# Patient Record
Sex: Female | Born: 1960 | Race: White | Hispanic: No | Marital: Married | Smoking: Never smoker
Health system: Southern US, Community
[De-identification: ages and names within clinical notes are randomized; demographics above are authoritative.]

## PROBLEM LIST (undated history)

## (undated) DIAGNOSIS — E079 Disorder of thyroid, unspecified: Secondary | ICD-10-CM

---

## 2005-06-01 ENCOUNTER — Ambulatory Visit: Payer: Self-pay | Admitting: Internal Medicine

## 2006-04-16 ENCOUNTER — Emergency Department (HOSPITAL_COMMUNITY): Admission: EM | Admit: 2006-04-16 | Discharge: 2006-04-16 | Payer: Self-pay | Admitting: Emergency Medicine

## 2006-05-01 ENCOUNTER — Inpatient Hospital Stay (HOSPITAL_COMMUNITY): Admission: AD | Admit: 2006-05-01 | Discharge: 2006-05-01 | Payer: Self-pay | Admitting: Obstetrics & Gynecology

## 2006-05-05 ENCOUNTER — Inpatient Hospital Stay (HOSPITAL_COMMUNITY): Admission: AD | Admit: 2006-05-05 | Discharge: 2006-05-06 | Payer: Self-pay | Admitting: Gynecology

## 2006-05-18 ENCOUNTER — Ambulatory Visit: Payer: Self-pay | Admitting: Obstetrics and Gynecology

## 2006-05-18 ENCOUNTER — Encounter (INDEPENDENT_AMBULATORY_CARE_PROVIDER_SITE_OTHER): Payer: Self-pay | Admitting: Specialist

## 2006-05-18 ENCOUNTER — Other Ambulatory Visit: Admission: RE | Admit: 2006-05-18 | Discharge: 2006-05-18 | Payer: Self-pay | Admitting: Obstetrics and Gynecology

## 2006-05-18 ENCOUNTER — Encounter (INDEPENDENT_AMBULATORY_CARE_PROVIDER_SITE_OTHER): Payer: Self-pay | Admitting: *Deleted

## 2007-05-21 ENCOUNTER — Encounter: Payer: Self-pay | Admitting: Internal Medicine

## 2007-05-21 DIAGNOSIS — E039 Hypothyroidism, unspecified: Secondary | ICD-10-CM | POA: Insufficient documentation

## 2007-05-22 ENCOUNTER — Ambulatory Visit: Payer: Self-pay | Admitting: Internal Medicine

## 2007-07-26 ENCOUNTER — Encounter (INDEPENDENT_AMBULATORY_CARE_PROVIDER_SITE_OTHER): Payer: Self-pay | Admitting: *Deleted

## 2007-07-26 ENCOUNTER — Ambulatory Visit: Payer: Self-pay | Admitting: *Deleted

## 2008-08-14 ENCOUNTER — Emergency Department (HOSPITAL_COMMUNITY): Admission: EM | Admit: 2008-08-14 | Discharge: 2008-08-14 | Payer: Self-pay | Admitting: Emergency Medicine

## 2010-12-28 NOTE — Group Therapy Note (Signed)
NAMEARTA, STUMP.:  [id number]   MEDICAL RECORD NO.:  [id number]          PATIENT TYPE:  WOC   LOCATION:  [facility]                   FACILITY:  [facility]   PHYSICIAN:  Argentina Donovan, MD        DATE OF BIRTH:  [birth year]   DATE OF SERVICE:  07/26/2007                                  CLINIC NOTE   CHIEF COMPLAINT:  Heavy vaginal bleeding and yearly Pap smear.   HISTORY OF PRESENT ILLNESS:  This is a 50 year old gravida 4, para 4-0-0-  4 female from [place] presenting for yearly Pap smear.  She also has a  complaint of heavy vaginal bleeding, requiring 5-10 pads daily since  Thanksgiving, 2008.  She states that she had this occurring last year  from August through November.  She was seen by Dr. Okey Dupre here at the  [facility].  At that time, endometrial biopsy was performed  that showed secretory endometrium and benign scant endocervix.  Pap  smear at that time was negative as well.  She was started on Yaz oral  contraceptives pills, which helped with her bleeding somewhat.  She  states that in January of this year, she went to the [place], where she  had a dilatation and curettage to treat this heavy vaginal bleeding.  She states that her bleeding was improved, and she was having normal  periods that were monthly, lasting seven days, until this Thanksgiving.  At that time, she started having heavy bleeding daily until now,  requiring 5-10 pads daily.  She states that she has some cramping  abdominal pain with bleeding.  She has no other complaints today.  She  denies any breast masses, lesions, or other complaints today.   PAST MEDICAL HISTORY:  Hypothyroidism.   PAST SURGICAL HISTORY:  Dilatation and curettage in January of this  year.   MEDICATIONS:  1. Synthroid 88 mcg daily.  2. Yaz 1 tablet daily.   ALLERGIES:  No known drug allergies.   PHYSICAL EXAMINATION:  This is a well-appearing Caucasian female in no  distress.  VITALS:   Temperature 99.1, pulse 100, blood pressure 126/80.  Weight  161.6 pounds.  CARDIOVASCULAR:  Heart has a regular rate and rhythm.  No murmurs, rubs  or gallops are appreciated.  RESPIRATORY:  Lungs are clear to auscultation bilaterally.  BREASTS:  The breasts appear symmetric.  There is no mass or lump  palpated.  There is no nipple retraction or dimpling of the breast.  ABDOMEN:  Soft.  Mild diffuse tenderness to palpation.  No masses are  palpable.  GENITOURINARY:  Normal external female genitalia.  Vaginal mucosa is  pink and moist.  There is blood in the vaginal vault.  Cervix is  visualized midline with blood from the os.  Pap smear is collected on  bimanual examination.  Uterus is midline.  Normal size.  Adnexa very  difficult to palpate secondary to body habitus.  EXTREMITIES:  No clubbing, cyanosis or edema.   ASSESSMENT/PLAN:  This is a 50 year old [place] female with  dysfunctional uterine bleeding and yearly examination.  1. The patient  presents for breast examination and yearly Pap smear,      which were performed today.  2. Check CBC due to complaint of heavy vaginal bleeding.  Patient does      have a history of anemia in the past.  3. Will give a shot of Depo-Provera today to help stop her bleeding.  4. Will schedule for endometrial ablation, as the patient has      recurrent dysfunctional uterine bleeding.   This patient was discussed with Dr. Okey Dupre, and the surgery has been  scheduled with him.  The patient will be called with the date for that  surgery.     ______________________________  Karlton Lemon, MD    ______________________________  Argentina Donovan, MD    NS/MEDQ  D:  07/26/2007  T:  07/27/2007  Job:  [id number]

## 2010-12-31 NOTE — Group Therapy Note (Signed)
NAMEPAULLETTE, MCKAIN.:  [id number]   MEDICAL RECORD NO.:  [id number]          PATIENT TYPE:  WOC   LOCATION:  [facility]                   FACILITY:  [facility]   PHYSICIAN:  Argentina Donovan, MD        DATE OF BIRTH:  1961-03-26   DATE OF SERVICE:                                    CLINIC NOTE   The patient is a 50 year old, gravida 4, para 4-0-0-4, white female from  [place], who was seen two times in the emergency room at [facility]  in late September, and twice within a few days at [facility] because  of dysfunctional uterine bleeding.  On her first visit to [facility],  she had a hemoglobin of 9.6, four days later 8.6.  She had regular periods  up until this heavy bleeding started about a month and half ago.  She has  had no history of significant GYN problems.  She is on Synthroid 0.088 mg a  day and thyroid function tests were normal when done on that hospital visit.  She comes in today having been put on Provera by Dr. Hyacinth Meeker with  satisfactory result that did control the bleeding.  She has had an  ultrasound that showed a slight thickening in the endometrium, otherwise it  was fairly not remarkable.   PHYSICAL EXAMINATION:  ABDOMEN:  Soft, nontender.  No masses or  organomegaly.  EXTERNAL GENITALIA:  Normal.  BUS is within normal limits.  PELVIC:  Vagina was clean and well rugae.  Cervix is clean and parous.  Uterus is anterior of normal size, shape, and consistency.  Adnexa normal.   The uterus was sounded to a depth of 8-cm.  An endometrial biopsy was  obtained without incident.  Also, a Pap smear was taken.   The plan was to switch the patient over to oral contraceptives.  She is a  nonsmoker.  We started with the Yaz, if that is satisfactory and can control  it, she will be able to stay on some oral contraceptive.  If she starts  having significant breakthrough bleeding or we cannot control it for other  reasons, endometrial ablation  has been explained to the patient and that  will be our next step.   CBC will be drawn today and the patient is and will remain on iron.   IMPRESSION:  Dysfunctional uterine bleeding.   Pap smear endometrial biopsy and CBC taken.   Rx Yaz, samples of 3 months have been given to the patient, __________  prescription for a year.           ______________________________  Argentina Donovan, MD     PR/MEDQ  D:  05/18/2006  T:  05/19/2006  Job:  [id number]

## 2016-05-29 ENCOUNTER — Emergency Department (HOSPITAL_COMMUNITY)
Admission: EM | Admit: 2016-05-29 | Discharge: 2016-05-30 | Disposition: A | Discharge status: Home / Self Care | Payer: Self-pay | Attending: Emergency Medicine | Admitting: Emergency Medicine

## 2016-05-29 ENCOUNTER — Emergency Department (HOSPITAL_COMMUNITY): Payer: Self-pay

## 2016-05-29 ENCOUNTER — Encounter (HOSPITAL_COMMUNITY): Payer: Self-pay

## 2016-05-29 DIAGNOSIS — R000 Tachycardia, unspecified: Secondary | ICD-10-CM

## 2016-05-29 DIAGNOSIS — E039 Hypothyroidism, unspecified: Secondary | ICD-10-CM | POA: Insufficient documentation

## 2016-05-29 DIAGNOSIS — R079 Chest pain, unspecified: Secondary | ICD-10-CM | POA: Insufficient documentation

## 2016-05-29 DIAGNOSIS — Z7982 Long term (current) use of aspirin: Secondary | ICD-10-CM | POA: Insufficient documentation

## 2016-05-29 LAB — BASIC METABOLIC PANEL
Anion gap: 12 (ref 5–15)
BUN: 15 mg/dL (ref 6–20)
CALCIUM: 9.8 mg/dL (ref 8.9–10.3)
CHLORIDE: 106 mmol/L (ref 101–111)
CO2: 22 mmol/L (ref 22–32)
CREATININE: 0.79 mg/dL (ref 0.44–1.00)
GFR calc Af Amer: >60 mL/min (ref >60)
GFR calc non Af Amer: >60 mL/min (ref >60)
Glucose, Bld: 145 mg/dL — ABNORMAL HIGH (ref 65–99)
Potassium: 3.5 mmol/L (ref 3.5–5.1)
SODIUM: 140 mmol/L (ref 135–145)

## 2016-05-29 LAB — I-STAT TROPONIN, ED: TROPONIN I, POC: 0.01 ng/mL (ref 0.00–0.08)

## 2016-05-29 LAB — CBC
HCT: 40.3 % (ref 36.0–46.0)
Hemoglobin: 13.9 g/dL (ref 12.0–15.0)
MCH: 30.6 pg (ref 26.0–34.0)
MCHC: 34.5 g/dL (ref 30.0–36.0)
MCV: 88.8 fL (ref 78.0–100.0)
PLATELETS: 238 10*3/uL (ref 150–400)
RBC: 4.54 MIL/uL (ref 3.87–5.11)
RDW: 13.1 % (ref 11.5–15.5)
WBC: 7.1 10*3/uL (ref 4.0–10.5)

## 2016-05-29 MED ORDER — SODIUM CHLORIDE 0.9 % IV BOLUS (SEPSIS)
1000.0000 mL | Freq: Once | INTRAVENOUS | Status: AC
  Administered 2016-05-30: 1000 mL via INTRAVENOUS

## 2016-05-29 MED ORDER — GI COCKTAIL ~~LOC~~
30.0000 mL | Freq: Once | ORAL | Status: AC
  Administered 2016-05-30: 30 mL via ORAL
  Filled 2016-05-29: qty 30

## 2016-05-29 NOTE — ED Triage Notes (Signed)
Pt complaining of sharp left chest pain that radiates to L arm. Pt also complaining of generalized weakness and headache x 4 days.

## 2016-05-29 NOTE — ED Provider Notes (Signed)
[facility] Note   CSN: [id number] Arrival date & time: 05/29/16  1840  By signing my name below, I, Suzan Slick. Elon Spanner, attest that this documentation has been prepared under the direction and in the presence of .  Electronically Signed: Suzan Slick. Elon Spanner, ED Scribe. 05/29/16. 11:49 PM.    History   Chief Complaint Chief Complaint  Patient presents with  . Chest Pain   The history is provided by the patient. No language interpreter was used.    HPI Comments: Summer Roberts is a 55 y.o. female with a PPMHx of hypothyroidism who presents to the Emergency Department complaining of intermittent, unchanged L sided chest pain that radiates down the L arm x 4 days. Pain is described as sharp. Episodes would last for a few minutes which is occasionally accompanied with "my heart racing". She also reports nausea, intermittent HA, generalized weakness, and mild diaphoresis, along with mild abdominal cramping and a few episodes of diarrhea. Pt states symptoms made her feel as though she could pass out. She admits symptoms are exacerbated with exertion and improved at rest. OTC Ibuprofen and ASA attempted at home with mild temporary improvement. Denies any fever, chills, or shortness of breath. No prior history of cancer. Denies any recent long distance travel. No prior surgeries within the last 6 months.  PCP: Kathyrn Sheriff, MD    History reviewed. No pertinent past medical history.  Patient Active Problem List   Diagnosis Date Noted  . HYPOTHYROIDISM 05/21/2007    History reviewed. No pertinent surgical history.  OB History    No data available       Home Medications    Prior to Admission medications   Medication Sig Start Date End Date Taking? Authorizing Provider  aspirin EC 81 MG tablet Take 81 mg by mouth daily as needed (for pain or headaches).  03/18/09  Yes Historical Provider, MD  ibuprofen (ADVIL,MOTRIN) 200 MG tablet Take 600 mg by mouth every 6 (six) hours as  needed for headache (or pain).   Yes Historical Provider, MD  levothyroxine (SYNTHROID, LEVOTHROID) 112 MCG tablet Take 112 mcg by mouth daily before breakfast. 10/23/15 10/22/16 Yes Historical Provider, MD    Family History History reviewed. No pertinent family history.  Social History Social History  Substance Use Topics  . Smoking status: Never Smoker  . Smokeless tobacco: Never Used  . Alcohol use No     Allergies   Review of patient's allergies indicates no known allergies.   Review of Systems Review of Systems  Constitutional: Positive for diaphoresis. Negative for chills and fever.  Cardiovascular: Positive for chest pain and palpitations.  Gastrointestinal: Positive for abdominal pain, diarrhea and nausea. Negative for vomiting.  Musculoskeletal: Positive for arthralgias.  Neurological: Positive for weakness and headaches.  Psychiatric/Behavioral: Negative for confusion.  All other systems reviewed and are negative.    Physical Exam Updated Vital Signs BP 147/88   Pulse 74   Temp 97.9 F (36.6 C) (Oral)   Resp 21   LMP  (LMP Unknown)   SpO2 98%   Physical Exam  Constitutional: She is oriented to person, place, and time. She appears well-developed and well-nourished. No distress.  HENT:  Head: Normocephalic and atraumatic.  Eyes: EOM are normal. Pupils are equal, round, and reactive to light.  Neck: Normal range of motion. Neck supple.  Cardiovascular: Normal rate, regular rhythm and normal heart sounds.  Exam reveals no gallop and no friction rub.   No murmur heard. Equal palpable pulses.  Pulmonary/Chest: Effort normal. She has no wheezes. She has no rales.  Abdominal: Soft. She exhibits no distension. There is no tenderness.  Musculoskeletal: She exhibits no edema or tenderness.  No lower extremity edema.  Neurological: She is alert and oriented to person, place, and time.  Skin: Skin is warm and dry. She is not diaphoretic.  Psychiatric: She has a  normal mood and affect. Her behavior is normal.  Nursing note and vitals reviewed.    ED Treatments / Results   DIAGNOSTIC STUDIES: Oxygen Saturation is 100% on RA, Normal by my interpretation.    COORDINATION OF CARE: 11:40 PM- Will give fluids and GI cocktail. Will order imaging, EKG, and blood work. Discussed treatment plan with pt at bedside and pt agreed to plan.     Labs (all labs ordered are listed, but only abnormal results are displayed) Labs Reviewed  BASIC METABOLIC PANEL - Abnormal; Notable for the following:       Result Value   Glucose, Bld 145 (*)    All other components within normal limits  CBC  I-STAT TROPOININ, ED  I-STAT TROPOININ, ED    EKG  EKG Interpretation  Date/Time:  Sunday May 29 2016 18:47:12 EDT Ventricular Rate:  117 PR Interval:  148 QRS Duration: 84 QT Interval:  320 QTC Calculation: 446 R Axis:   -91 Text Interpretation:  Sinus tachycardia Right superior axis deviation Pulmonary disease pattern Nonspecific ST abnormality Abnormal ECG No old tracing to compare Confirmed by Krystyne Tewksbury MD, DANIEL [id number]) on 05/29/2016 11:05:06 PM       Radiology Dg Chest 2 View  Result Date: 05/29/2016 CLINICAL DATA:  Dyspnea, central chest pain and left shoulder pain for the past 4 days. Fever, headache and nausea. EXAM: CHEST  2 VIEW COMPARISON:  08/14/2008 FINDINGS: Cardiomediastinal contours are within normal limits. Mild interstitial prominence may be due to small airway inflammation. No pneumonic consolidation. No pneumothorax, effusion or adenopathy. No acute osseous abnormality. IMPRESSION: Mild increase in bilateral interstitial prominence could be due to small airway inflammation otherwise negative exam. Electronically Signed   By: Tollie Ethavid  Kwon M.D.   On: 05/29/2016 19:29    Procedures Procedures (including critical care time)  Medications Ordered in ED Medications  sodium chloride 0.9 % bolus 1,000 mL (0 mLs Intravenous Stopped 05/30/16 0053)   gi cocktail (Maalox,Lidocaine,Donnatal) (30 mLs Oral Given 05/30/16 0003)     Initial Impression / Assessment and Plan / ED Course  I have reviewed the triage vital signs and the nursing notes.  Pertinent labs & imaging results that were available during my care of the patient were reviewed by me and considered in my medical decision making (see chart for details).  Clinical Course    55 yo F With a chief complaint of chest pain. This been going on for about 3 or 4 days. Associated with diarrhea and lower abdominal cramping. The diarrhea has resolved with patient continues to have this sensation of palpitations and shortness of breath when she gets up to walk. Initial HR was in the 120s.  Wells 1.5 with tachycardia. This resolved spontaneously without intervention. Patient's repeat EKG with no significant findings. Delta troponin negative. Discharge home.   1:36 AM:  I have discussed the diagnosis/risks/treatment options with the patient and family and believe the pt to be eligible for discharge home to follow-up with PCP. We also discussed returning to the ED immediately if new or worsening sx occur. We discussed the sx which are most concerning (e.g., sudden  worsening pain, fever, inability to tolerate by mouth) that necessitate immediate return. Medications administered to the patient during their visit and any new prescriptions provided to the patient are listed below.  Medications given during this visit Medications  sodium chloride 0.9 % bolus 1,000 mL (0 mLs Intravenous Stopped 05/30/16 0053)  gi cocktail (Maalox,Lidocaine,Donnatal) (30 mLs Oral Given 05/30/16 0003)     The patient appears reasonably screen and/or stabilized for discharge and I doubt any other medical condition or other [facility] requiring further screening, evaluation, or treatment in the ED at this time prior to discharge.   Final Clinical Impressions(s) / ED Diagnoses   Final diagnoses:  Nonspecific chest pain    Tachycardia    New Prescriptions New Prescriptions   No medications on file     Melene Plan, DO 05/30/16 0136

## 2016-05-30 LAB — I-STAT TROPONIN, ED: Troponin i, poc: 0.01 ng/mL (ref 0.00–0.08)

## 2016-05-30 NOTE — ED Notes (Signed)
Pt showing NAD. RR even and unlabored. Family @ bedside. Voices no questions/concerns.

## 2017-05-10 ENCOUNTER — Ambulatory Visit (HOSPITAL_COMMUNITY)
Admission: EM | Admit: 2017-05-10 | Discharge: 2017-05-10 | Disposition: A | Discharge status: Home / Self Care | Payer: BLUE CROSS/BLUE SHIELD | Attending: Internal Medicine | Admitting: Internal Medicine

## 2017-05-10 ENCOUNTER — Encounter (HOSPITAL_COMMUNITY): Payer: Self-pay | Admitting: *Deleted

## 2017-05-10 DIAGNOSIS — R0789 Other chest pain: Secondary | ICD-10-CM

## 2017-05-10 DIAGNOSIS — R1010 Upper abdominal pain, unspecified: Secondary | ICD-10-CM | POA: Diagnosis not present

## 2017-05-10 DIAGNOSIS — M79602 Pain in left arm: Secondary | ICD-10-CM

## 2017-05-10 HISTORY — DX: Disorder of thyroid, unspecified: E07.9

## 2017-05-10 MED ORDER — FAMOTIDINE 40 MG PO TABS: 40.0000 mg | ORAL_TABLET | Freq: Two times a day (BID) | ORAL | 0 refills | Status: DC

## 2017-05-10 NOTE — ED Provider Notes (Signed)
[facility]    CSN: [id number] Arrival date & time: 05/10/17  1002  History   Chief Complaint Chief Complaint  Patient presents with  . Arm Pain    HPI Summer Roberts is a 56 y.o. female.   HPI  Patient presents with midsternal chest pain and L arm pain.   Midsternal chest pain Began in the spring (~4-6 months ago). Located midsternally but has also had some upper abdominal pain around midline. Patient present before eating but is worse after eating. Also reports an odd sensation in her throat sometimes accompanying the pain. Says she does not have trouble swallowing but it feels "like it does when you try to swallow an egg." Denies regurgitation but endorses feeling of nausea at times. Takes aspirin which helps with pain some. Her brother is an anesthesiologist and told her to start taking Pepcid. She has been taking Pepcid  daily for the past three days but has not noticed any improvement. Denies difficulty breathing or diaphoresis accompanying chest pain. Is concerned that she may have had/be having a heart attack, which is primarily why she came in today.   L arm pain Also began in the spring. Pain located in upper arm. Patient can point to exact location of pain. Has not taken any medicine to help with pain. Also endorses some weakness in this arm. No history of trauma. 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 Denies erythema, ecchymosis. Pain is worse with movement.    Past Medical History:  Diagnosis Date  . Thyroid disease     Patient Active Problem List   Diagnosis Date Noted  . HYPOTHYROIDISM 05/21/2007    History reviewed. No pertinent surgical history.  OB History    No data available       Home Medications    Prior to Admission medications   Medication Sig Start Date End Date Taking? Authorizing Provider  aspirin EC 81 MG tablet Take 81 mg by mouth daily as needed (for pain or  headaches).  03/18/09   [provider]  famotidine (PEPCID) 40 MG tablet Take 1 tablet (40 mg total) by mouth 2 (two) times daily. 05/10/17 05/10/18  Marquette Saa, MD  ibuprofen (ADVIL,MOTRIN) 200 MG tablet Take 600 mg by mouth every 6 (six) hours as needed for headache (or pain).    [provider]  levothyroxine (SYNTHROID, LEVOTHROID) 112 MCG tablet Take 112 mcg by mouth daily before breakfast. 10/23/15 10/22/16  [provider]    Family History History reviewed. No pertinent family history.  Social History Social History  Substance Use Topics  . Smoking status: Never Smoker  . Smokeless tobacco: Never Used  . Alcohol use No     Allergies   Patient has no known allergies.   Review of Systems Review of Systems  Constitutional: Negative for diaphoresis.  Respiratory: Negative for shortness of breath.   Cardiovascular: Negative for palpitations.       Midsternal chest pain  Gastrointestinal: Positive for abdominal pain (Upper quadrants at midline) and nausea. Negative for vomiting.  Skin: Negative for wound.  Neurological: Positive for weakness (L arm).    Physical Exam  Updated Vital Signs BP (!) 142/84 (BP Location: Right Arm)   Pulse 78   Temp 98.6 F (37 C) (Oral)   Resp 18   LMP  (LMP Unknown)   SpO2 99%   Physical Exam  Constitutional: She is oriented to person,  place, and time. She appears well-developed and well-nourished. No distress.  HENT:  Head: Normocephalic and atraumatic.  Nose: Nose normal.  Mouth/Throat: Oropharynx is clear and moist. No oropharyngeal exudate.  Eyes: Pupils are equal, round, and reactive to light. Conjunctivae and EOM are normal. Right eye exhibits no discharge. Left eye exhibits no discharge.  Neck: Normal range of motion. Neck supple. No thyromegaly present.  Cardiovascular: Normal rate, regular rhythm and normal heart sounds.   No murmur heard. Pulmonary/Chest: Effort normal and breath  sounds normal. No respiratory distress. She has no wheezes.  No TTP of chest at midline or elsewhere  Abdominal: Soft. Bowel sounds are normal. She exhibits no distension and no mass. There is no tenderness. There is no guarding.  Musculoskeletal: Normal range of motion.  5/5 strength upper extremities bilaterally. Normal grip strength. TTP in L upper arm at area where patient reports pain.   Lymphadenopathy:    She has no cervical adenopathy.  Neurological: She is alert and oriented to person, place, and time. No cranial nerve deficit.  Skin:  No erythema, ecchymosis, or other skin changes on arm. Skin warm and dry.   Psychiatric: She has a normal mood and affect. Her behavior is normal.    UC Treatments / Results  Labs (all labs ordered are listed, but only abnormal results are displayed) Labs Reviewed - No data to display  EKG  EKG Interpretation None       Radiology No results found.  Procedures Procedures (including critical care time)  Medications Ordered in UC Medications - No data to display   Initial Impression / Assessment and Plan / UC Course  I have reviewed the triage vital signs and the nursing notes.  Pertinent labs & imaging results that were available during my care of the patient were reviewed by me and considered in my medical decision making (see chart for details).    Patient presenting with midsternal chest pain and L upper arm pain. Symptoms do not seem related. Regarding chest pain, does not seem cardiac in etiology. EKG obtained which showed NSR and no signs of current or prior MI. No TTP of chest, making MSK etiology less likely. GERD more likely diagnosis, given location of pain (midsternal and upper abdominal), as well as worsening with food. Patient has been taking Pepcid, but only  and only for three days. Suspect will have improvement with continued Pepcid use at higher dose. Will begin trial of  BID x12wks.  Arm pain most likely MSK  etiology, as patient able to specifically localize pain and there is TTP in that area. Pain also worse with movement supportive of MSK etiology. No abnormalities noted on exam other than tenderness. Treat with ibuprofen/Tylenol PRN.  Strict return precautions given. Also encouraged patient to schedule appt with her PCP at her earliest convenience to ensure symptoms improving with higher dose of Pepcid.   Final Clinical Impressions(s) / UC Diagnoses   Final diagnoses:  Left arm pain  Midsternal chest pain    New Prescriptions Current Discharge Medication List    START taking these medications   Details  famotidine (PEPCID) 40 MG tablet Take 1 tablet (40 mg total) by mouth 2 (two) times daily. Qty: 180 tablet, Refills: 0       Tarri Abernethy, MD, MPH PGY-3     Marquette Saa, MD 05/10/17 [phone]

## 2017-05-10 NOTE — Discharge Instructions (Signed)
It was nice meeting you today Ms. Weare!  I do not think your chest and arm pain are related to your heart. Your EKG did not show any signs of a heart attack.   Your chest pain is probably due to acid reflux (heartburn). Please begin taking the prescription famotidine one tablet two times a day. This is a higher dose than the over the counter version. You will take two tablets a day for the next three months until all the pills are finished.   For your arm pain, you can take Tylenol or ibuprofen as needed every 4-6 hours.   If you have more severe chest pain with difficulty breathing or sweating, please go to the emergency room.   Please schedule an appointment with your regular doctor at your earliest convenience to make sure the higher dose of famotidine is helping with your symptoms.   If you have any questions or concerns, please feel free to call the clinic.   Be well,  Dr. Natale Milch

## 2017-05-10 NOTE — ED Triage Notes (Signed)
Pt  Reports   Symptoms  Of  l   Shoulder /   Arm  Pain   X  5 days   With   Some  Epigastric  Pain  As   Well the  Epigastric  Pain is  intermittant      The  Pt  Has  A  History  Of thyriod disease

## 2018-04-09 ENCOUNTER — Encounter (HOSPITAL_COMMUNITY): Payer: Self-pay | Admitting: Emergency Medicine

## 2018-04-09 ENCOUNTER — Ambulatory Visit (HOSPITAL_COMMUNITY)
Admission: EM | Admit: 2018-04-09 | Discharge: 2018-04-09 | Disposition: A | Discharge status: Home / Self Care | Payer: BLUE CROSS/BLUE SHIELD | Attending: Family Medicine | Admitting: Family Medicine

## 2018-04-09 ENCOUNTER — Emergency Department (HOSPITAL_COMMUNITY): Payer: BLUE CROSS/BLUE SHIELD

## 2018-04-09 ENCOUNTER — Emergency Department (HOSPITAL_COMMUNITY)
Admission: EM | Admit: 2018-04-09 | Discharge: 2018-04-09 | Disposition: A | Discharge status: Home / Self Care | Payer: BLUE CROSS/BLUE SHIELD | Attending: Emergency Medicine | Admitting: Emergency Medicine

## 2018-04-09 DIAGNOSIS — R079 Chest pain, unspecified: Secondary | ICD-10-CM

## 2018-04-09 DIAGNOSIS — R002 Palpitations: Secondary | ICD-10-CM

## 2018-04-09 DIAGNOSIS — F439 Reaction to severe stress, unspecified: Secondary | ICD-10-CM | POA: Diagnosis not present

## 2018-04-09 DIAGNOSIS — F419 Anxiety disorder, unspecified: Secondary | ICD-10-CM

## 2018-04-09 DIAGNOSIS — Z79899 Other long term (current) drug therapy: Secondary | ICD-10-CM | POA: Diagnosis not present

## 2018-04-09 DIAGNOSIS — E039 Hypothyroidism, unspecified: Secondary | ICD-10-CM | POA: Diagnosis not present

## 2018-04-09 LAB — BASIC METABOLIC PANEL
Anion gap: 12 (ref 5–15)
BUN: 6 mg/dL (ref 6–20)
CALCIUM: 10 mg/dL (ref 8.9–10.3)
CHLORIDE: 107 mmol/L (ref 98–111)
CO2: 25 mmol/L (ref 22–32)
CREATININE: 0.63 mg/dL (ref 0.44–1.00)
Glucose, Bld: 109 mg/dL — ABNORMAL HIGH (ref 70–99)
Potassium: 3.9 mmol/L (ref 3.5–5.1)
SODIUM: 144 mmol/L (ref 135–145)

## 2018-04-09 LAB — CBC WITH DIFFERENTIAL/PLATELET
Abs Immature Granulocytes: 0 10*3/uL (ref 0.0–0.1)
BASOS PCT: 0 %
Basophils Absolute: 0 10*3/uL (ref 0.0–0.1)
EOS ABS: 0 10*3/uL (ref 0.0–0.7)
EOS PCT: 0 %
HCT: 45.4 % (ref 36.0–46.0)
Hemoglobin: 15.4 g/dL — ABNORMAL HIGH (ref 12.0–15.0)
IMMATURE GRANULOCYTES: 0 %
LYMPHS ABS: 1.4 10*3/uL (ref 0.7–4.0)
Lymphocytes Relative: 26 %
MCH: 30.4 pg (ref 26.0–34.0)
MCHC: 33.9 g/dL (ref 30.0–36.0)
MCV: 89.7 fL (ref 78.0–100.0)
Monocytes Absolute: 0.3 10*3/uL (ref 0.1–1.0)
Monocytes Relative: 7 %
NEUTROS PCT: 67 %
Neutro Abs: 3.5 10*3/uL (ref 1.7–7.7)
PLATELETS: 236 10*3/uL (ref 150–400)
RBC: 5.06 MIL/uL (ref 3.87–5.11)
RDW: 12.6 % (ref 11.5–15.5)
WBC: 5.3 10*3/uL (ref 4.0–10.5)

## 2018-04-09 LAB — I-STAT TROPONIN, ED
TROPONIN I, POC: 0 ng/mL (ref 0.00–0.08)
Troponin i, poc: 0 ng/mL (ref 0.00–0.08)
Troponin i, poc: 0 ng/mL (ref 0.00–0.08)

## 2018-04-09 LAB — D-DIMER, QUANTITATIVE (NOT AT ARMC): D DIMER QUANT: 0.92 ug{FEU}/mL — AB (ref 0.00–0.50)

## 2018-04-09 MED ORDER — IOPAMIDOL (ISOVUE-370) INJECTION 76%
100.0000 mL | Freq: Once | INTRAVENOUS | Status: AC | PRN
  Administered 2018-04-09: 100 mL via INTRAVENOUS

## 2018-04-09 MED ORDER — SODIUM CHLORIDE 0.9 % IV BOLUS
1000.0000 mL | Freq: Once | INTRAVENOUS | Status: AC
  Administered 2018-04-09: 1000 mL via INTRAVENOUS

## 2018-04-09 MED ORDER — IOPAMIDOL (ISOVUE-370) INJECTION 76%
INTRAVENOUS | Status: AC
  Filled 2018-04-09: qty 100

## 2018-04-09 NOTE — ED Triage Notes (Signed)
Pt here for increased BP and increased stress due to family issues; pt sts she has not been eating well due to no appetite and stress; pt appears anxious

## 2018-04-09 NOTE — Discharge Instructions (Signed)
Please go to the ER for further evaluation of your chest pain and palpitations.

## 2018-04-09 NOTE — ED Provider Notes (Signed)
[facility] EMERGENCY DEPARTMENT Provider Note   CSN: [id number] Arrival date & time: 04/09/18  1347     History   Chief Complaint Chief Complaint  Patient presents with  . EKG Changes  . Palpitations    HPI Summer Roberts is a 57 y.o. female.  HPI Pt states she has felt bad this past week.  She has been very stressed, her son is getting divorced.  His wife left him.  Her blood pressure has been up and down  She has been having pain off and on her chest.  It is in the center of the chest, not too sharp but each day it is different.  Nothing seems to bring it on in particlar although today in the morning was worse.  It lasted for 7 hours today and the whole last weekend.  She is also worried about her blood pressure being high.  She has a precription but only takes it when she feels like she needs it.  (losartan) 25mg   Pt feels like she is dehydrated and needs IV fluids.  She has not wanted to drink much lately.  She has a priamry care doctor but it is in [place].  She went to an urgent care and was sent to the ED. Past Medical History:  Diagnosis Date  . Thyroid disease     Patient Active Problem List   Diagnosis Date Noted  . HYPOTHYROIDISM 05/21/2007    No past surgical history on file.   OB History   None      Home Medications    Prior to Admission medications   Medication Sig Start Date End Date Taking? Authorizing Provider  acetaminophen (TYLENOL) 500 MG tablet Take 1,000 mg by mouth every 6 (six) hours as needed for mild pain.   Yes [provider]  aspirin EC 81 MG tablet Take 81 mg by mouth daily as needed (for pain or headaches).  03/18/09  Yes [provider]  aspirin-acetaminophen-caffeine (EXCEDRIN MIGRAINE) [phone] MG tablet Take 2 tablets by mouth as needed (chest pain).   Yes [provider]  famotidine (PEPCID) 40 MG tablet Take 1 tablet (40 mg total) by mouth 2 (two) times daily. 05/10/17 05/10/18  Yes Marquette Saa, MD  ibuprofen (ADVIL,MOTRIN) 200 MG tablet Take 600 mg by mouth every 6 (six) hours as needed for headache (or pain).   Yes [provider]  levothyroxine (SYNTHROID, LEVOTHROID) 125 MCG tablet Take 125 mcg by mouth daily before breakfast.  10/23/15 04/09/18 Yes [provider]  losartan (COZAAR) 25 MG tablet Take 25 mg by mouth daily. 01/16/18  Yes [provider]  naproxen sodium (ALEVE) 220 MG tablet Take 220 mg by mouth as needed (chest pain).   Yes [provider]  VALERIAN ROOT PO Take 2 capsules by mouth 2 (two) times daily.   Yes [provider]    Family History No family history on file.  Social History Social History   Tobacco Use  . Smoking status: Never Smoker  . Smokeless tobacco: Never Used  Substance Use Topics  . Alcohol use: No  . Drug use: Never     Allergies   Patient has no known allergies.   Review of Systems Review of Systems  Constitutional: Negative for fever.  Respiratory: Positive for shortness of breath (sometimes).   Cardiovascular: Positive for chest pain.  Gastrointestinal: Negative for vomiting.  Genitourinary: Positive for frequency.  Neurological: Negative for headaches.  Pins and needles in legs, legs feel weak  Psychiatric/Behavioral: Negative for self-injury.  All other systems reviewed and are negative.    Physical Exam Updated Vital Signs BP 139/80   Pulse 77   Temp 98.2 F (36.8 C) (Oral)   Resp 13   Ht 1.626 m (5\' 4" )   Wt 81.2 kg   LMP  (LMP Unknown)   SpO2 97%   BMI 30.73 kg/m   Physical Exam  Constitutional: She appears well-developed and well-nourished. No distress.  HENT:  Head: Normocephalic and atraumatic.  Right Ear: External ear normal.  Left Ear: External ear normal.  Eyes: Conjunctivae are normal. Right eye exhibits no discharge. Left eye exhibits no discharge. No scleral icterus.  Neck: Neck supple. No tracheal deviation  present.  Cardiovascular: Normal rate, regular rhythm and intact distal pulses.  Pulmonary/Chest: Effort normal and breath sounds normal. No stridor. No respiratory distress. She has no wheezes. She has no rales.  Abdominal: Soft. Bowel sounds are normal. She exhibits no distension. There is no tenderness. There is no rebound and no guarding.  Musculoskeletal: She exhibits no edema or tenderness.  Neurological: She is alert. She has normal strength. No cranial nerve deficit (no facial droop, extraocular movements intact, no slurred speech) or sensory deficit. She exhibits normal muscle tone. She displays no seizure activity. Coordination normal.  Skin: Skin is warm and dry. No rash noted.  Psychiatric: She has a normal mood and affect.  Nursing note and vitals reviewed.    ED Treatments / Results  Labs (all labs ordered are listed, but only abnormal results are displayed) Labs Reviewed  BASIC METABOLIC PANEL - Abnormal; Notable for the following components:      Result Value   Glucose, Bld 109 (*)    All other components within normal limits  CBC WITH DIFFERENTIAL/PLATELET - Abnormal; Notable for the following components:   Hemoglobin 15.4 (*)    All other components within normal limits  D-DIMER, QUANTITATIVE (NOT AT [facility]) - Abnormal; Notable for the following components:   D-Dimer, Quant 0.92 (*)    All other components within normal limits  I-STAT TROPONIN, ED  I-STAT TROPONIN, ED  I-STAT TROPONIN, ED    EKG EKG Interpretation  Date/Time:  Monday April 09 2018 14:02:05 EDT Ventricular Rate:  101 PR Interval:  130 QRS Duration: 84 QT Interval:  336 QTC Calculation: 435 R Axis:   -66 Text Interpretation:  Sinus tachycardia Left axis deviation Pulmonary disease pattern Nonspecific ST abnormality Abnormal ECG No significant change since last tracing Confirmed by Linwood DibblesKnapp, Maryon Kemnitz [phone]([id number]) on 04/09/2018 3:00:01 PM   Radiology Dg Chest 2 View  Result Date: 04/09/2018 CLINICAL  DATA:  Central chest pain, shortness of breath and nausea for a week. EXAM: CHEST - 2 VIEW COMPARISON:  Chest radiograph May 29, 2016 FINDINGS: Cardiomediastinal silhouette is normal. No pleural effusions or focal consolidations. Similar bronchitic changes. Trachea projects midline and there is no pneumothorax. Soft tissue planes and included osseous structures are non-suspicious. IMPRESSION: Similar bronchitic changes without focal consolidation. Electronically Signed   By: Awilda Metroourtnay  Bloomer M.D.   On: 04/09/2018 14:23   Ct Angio Chest Pe W And/or Wo Contrast  Result Date: 04/09/2018 CLINICAL DATA:  Feeling unwell for 1 week, chest pressure. EXAM: CT ANGIOGRAPHY CHEST WITH CONTRAST TECHNIQUE: Multidetector CT imaging of the chest was performed using the standard protocol during bolus administration of intravenous contrast. Multiplanar CT image reconstructions and MIPs were obtained to evaluate the vascular anatomy. CONTRAST:  100mL  ISOVUE-370 IOPAMIDOL (ISOVUE-370) INJECTION 76% COMPARISON:  Chest radiograph April 09, 2018 FINDINGS: CARDIOVASCULAR: Adequate contrast opacification of the pulmonary artery's. Main pulmonary artery is not enlarged. No pulmonary arterial filling defects to the level of the subsegmental branches. Heart size is normal, no right heart strain. Small pericardial effusion. Thoracic aorta is normal course and caliber, unremarkable. MEDIASTINUM/NODES: No lymphadenopathy by CT size criteria. LUNGS/PLEURA: Tracheobronchial tree is patent, no pneumothorax. Minimal bronchial wall thickening. No pleural effusions, focal consolidations, pulmonary nodules or masses. Dependent atelectasis. UPPER ABDOMEN: Included view of the abdomen is unremarkable. MUSCULOSKELETAL: Visualized soft tissues and included osseous structures appear normal. Review of the MIP images confirms the above findings. IMPRESSION: 1. No acute pulmonary embolism. 2. Bronchial wall thickening seen with bronchitis or reactive  airway disease. No pneumonia. Electronically Signed   By: Awilda Metro M.D.   On: 04/09/2018 18:48    Procedures Procedures (including critical care time)  Medications Ordered in ED Medications  iopamidol (ISOVUE-370) 76 % injection (has no administration in time range)  sodium chloride 0.9 % bolus 1,000 mL (0 mLs Intravenous Stopped 04/09/18 1807)  iopamidol (ISOVUE-370) 76 % injection 100 mL (100 mLs Intravenous Contrast Given 04/09/18 1820)     Initial Impression / Assessment and Plan / ED Course  I have reviewed the triage vital signs and the nursing notes.  Pertinent labs & imaging results that were available during my care of the patient were reviewed by me and considered in my medical decision making (see chart for details).   Patient presented to the emergency room for various complaints.  She was primarily having issues with chest pain and was concerned about her blood pressure.  Patient mentioned having significant family stressors at home.  Patient's laboratory tests were notable for an elevated d-dimer.  CT angios was performed which fortunately did not reveal any evidence of pulmonary embolism.  Patient symptoms are atypical for acute coronary syndrome.  She is overall low risk and serial troponins are negative.  I think there may be a significant stress and anxiety component contributing to some of her symptoms she is not having any issues with severe depression or suicidal ideation.  We discussed outpatient follow-up with her primary care doctor.  Final Clinical Impressions(s) / ED Diagnoses   Final diagnoses:  Palpitations  Chest pain, unspecified type  Stress at home    ED Discharge Orders    None       Linwood Dibbles, MD 04/09/18 1911

## 2018-04-09 NOTE — ED Notes (Signed)
Pt ambulatory to bathroom without any problems 

## 2018-04-09 NOTE — ED Notes (Signed)
Pt to CT at this time.

## 2018-04-09 NOTE — ED Provider Notes (Signed)
[facility]    CSN: [id number] Arrival date & time: 04/09/18  1224     History   Chief Complaint Chief Complaint  Patient presents with  . Hypertension    HPI Summer Roberts is a 57 y.o. female.   Summer Roberts presents with complaints of not feeling well for the past week. States she has headache. Also with palpitations, chest pressure, anxious feeling, some nausea. Worse when she gets up, states she has been laying down a lot to feel better. Has been taking her daily losartan for BP as well as other OTC medications to help her "calm" but have not helped. Endorses increased stress recently due to family stressors going on currently.  Taking synthroid for hypothyroidism and has been taking this regularly. Has had evaluations for CP and palpitations in the past, treated with antacids and for musculoskeletal pain in the past. No other cardiac history, father did have heart disease. No diabetes.    ROS per HPI.      Past Medical History:  Diagnosis Date  . Thyroid disease     Patient Active Problem List   Diagnosis Date Noted  . HYPOTHYROIDISM 05/21/2007    History reviewed. No pertinent surgical history.  OB History   None      Home Medications    Prior to Admission medications   Medication Sig Start Date End Date Taking? Authorizing Provider  aspirin EC 81 MG tablet Take 81 mg by mouth daily as needed (for pain or headaches).  03/18/09   [provider]  famotidine (PEPCID) 40 MG tablet Take 1 tablet (40 mg total) by mouth 2 (two) times daily. 05/10/17 05/10/18  Marquette SaaLancaster, Abigail Joseph, MD  ibuprofen (ADVIL,MOTRIN) 200 MG tablet Take 600 mg by mouth every 6 (six) hours as needed for headache (or pain).    [provider]  levothyroxine (SYNTHROID, LEVOTHROID) 112 MCG tablet Take 112 mcg by mouth daily before breakfast. 10/23/15 10/22/16  [provider]    Family History History reviewed. No pertinent family history.  Social  History Social History   Tobacco Use  . Smoking status: Never Smoker  . Smokeless tobacco: Never Used  Substance Use Topics  . Alcohol use: No  . Drug use: No     Allergies   Patient has no known allergies.   Review of Systems Review of Systems   Physical Exam Triage Vital Signs ED Triage Vitals [04/09/18 1320]  Enc Vitals Group     BP (!) 146/74     Pulse Rate (!) 109     Resp 20     Temp 98.1 F (36.7 C)     Temp Source Oral     SpO2 100 %     Weight      Height      Head Circumference      Peak Flow      Pain Score      Pain Loc      Pain Edu?      Excl. in GC?    No data found.  Updated Vital Signs BP (!) 146/74 (BP Location: Left Arm)   Pulse (!) 109   Temp 98.1 F (36.7 C) (Oral)   Resp 20   LMP  (LMP Unknown)   SpO2 100%    Physical Exam  Constitutional: She is oriented to person, place, and time. She appears well-developed and well-nourished. No distress.  Cardiovascular: Regular rhythm and normal heart sounds. Tachycardia present.  Pulmonary/Chest: Effort normal and  breath sounds normal.  Neurological: She is alert and oriented to person, place, and time.  Skin: Skin is warm and dry.  Psychiatric:  Does appear mildly anxious      UC Treatments / Results  Labs (all labs ordered are listed, but only abnormal results are displayed) Labs Reviewed - No data to display  EKG None  Radiology No results found.  Procedures Procedures (including critical care time)  Medications Ordered in UC Medications - No data to display  Initial Impression / Assessment and Plan / UC Course  I have reviewed the triage vital signs and the nursing notes.  Pertinent labs & imaging results that were available during my care of the patient were reviewed by me and considered in my medical decision making (see chart for details).     Patient tachycardic here in clinic today. In chart review this is atypical for here, even when evaluated for chest pain.  Noted some stchanges to ekg which were not seen on previous ekg. Does endorse current central chest pain currently. Recommend cardiac evaluation in ed at this time to ensure not acs. Patient verbalized understanding and agreeable to plan.  Staff provided assistance to ED at this time.  Final Clinical Impressions(s) / UC Diagnoses   Final diagnoses:  Palpitations  Chest pain, unspecified type  Anxiety     Discharge Instructions     Please go to the ER for further evaluation of your chest pain and palpitations.    ED Prescriptions    None     Controlled Substance Prescriptions [place] Controlled Substance Registry consulted? Not Applicable   Georgetta Haber, NP 04/09/18 1347

## 2018-04-09 NOTE — Discharge Instructions (Addendum)
Follow up with your primary care doctor as we discussed, return as needed for worsening symptoms

## 2018-04-09 NOTE — ED Provider Notes (Signed)
Patient placed in Quick Look pathway, seen and evaluated   Chief Complaint: Chest pain  HPI:   57 y.o. who presents for evaluation of CP, palpitations, headache and genealized malaise that has been ongoing for about a week. She reports that she has been very stressed. Went to Urgent Care today for evaluation of symptoms. She was noted to have some EKG changes and dwas sent to the ED for further evaluation. She does reports 5/10 central chest pain right now. No SOB, fevers.   ROS: Chest pain   Physical Exam:   Gen: No distress  Neuro: Awake and Alert  Skin: Warm    Focused Exam: RRR, Lungs CTAB   Initiation of care has begun. The patient has been counseled on the process, plan, and necessity for staying for the completion/evaluation, and the remainder of the medical screening examination    Maxwell CaulLayden, Ardena Gangl A, PA-C 04/09/18 1400    Tegeler, Canary Brimhristopher J, MD 04/09/18 [phone]

## 2018-04-09 NOTE — ED Triage Notes (Signed)
[facility]. She reports having palpitations, feeling like her heart is pounding, nausea, and central chest pain with radiation to L side of chest over the past week. She does endorse HTN and takes medication when needed (states that she took 2 pills today and feels like they haven't helped). She denies SOB or dizziness. She states she has been under increased stress this past week.

## 2018-05-02 ENCOUNTER — Emergency Department (HOSPITAL_COMMUNITY)
Admission: EM | Admit: 2018-05-02 | Discharge: 2018-05-02 | Disposition: A | Discharge status: Home / Self Care | Payer: BLUE CROSS/BLUE SHIELD | Attending: Emergency Medicine | Admitting: Emergency Medicine

## 2018-05-02 ENCOUNTER — Encounter (HOSPITAL_COMMUNITY): Payer: Self-pay | Admitting: Emergency Medicine

## 2018-05-02 ENCOUNTER — Emergency Department (HOSPITAL_COMMUNITY): Payer: BLUE CROSS/BLUE SHIELD

## 2018-05-02 DIAGNOSIS — R002 Palpitations: Secondary | ICD-10-CM

## 2018-05-02 DIAGNOSIS — Z7982 Long term (current) use of aspirin: Secondary | ICD-10-CM | POA: Diagnosis not present

## 2018-05-02 DIAGNOSIS — E039 Hypothyroidism, unspecified: Secondary | ICD-10-CM | POA: Insufficient documentation

## 2018-05-02 DIAGNOSIS — Z79899 Other long term (current) drug therapy: Secondary | ICD-10-CM | POA: Diagnosis not present

## 2018-05-02 LAB — CBC
HCT: 40.2 % (ref 36.0–46.0)
HEMOGLOBIN: 13.4 g/dL (ref 12.0–15.0)
MCH: 30.6 pg (ref 26.0–34.0)
MCHC: 33.3 g/dL (ref 30.0–36.0)
MCV: 91.8 fL (ref 78.0–100.0)
Platelets: 205 10*3/uL (ref 150–400)
RBC: 4.38 MIL/uL (ref 3.87–5.11)
RDW: 13 % (ref 11.5–15.5)
WBC: 7.3 10*3/uL (ref 4.0–10.5)

## 2018-05-02 LAB — BASIC METABOLIC PANEL
Anion gap: 13 (ref 5–15)
BUN: 12 mg/dL (ref 6–20)
CALCIUM: 9.4 mg/dL (ref 8.9–10.3)
CO2: 23 mmol/L (ref 22–32)
Chloride: 105 mmol/L (ref 98–111)
Creatinine, Ser: 0.78 mg/dL (ref 0.44–1.00)
GFR calc non Af Amer: >60 mL/min (ref >60)
GLUCOSE: 122 mg/dL — AB (ref 70–99)
Potassium: 4.1 mmol/L (ref 3.5–5.1)
Sodium: 141 mmol/L (ref 135–145)

## 2018-05-02 LAB — I-STAT TROPONIN, ED: Troponin i, poc: 0 ng/mL (ref 0.00–0.08)

## 2018-05-02 LAB — I-STAT BETA HCG BLOOD, ED (MC, WL, AP ONLY): I-stat hCG, quantitative: <5 m[IU]/mL (ref <5)

## 2018-05-02 MED ORDER — LORAZEPAM 1 MG PO TABS
1.0000 mg | ORAL_TABLET | Freq: Once | ORAL | Status: AC
  Administered 2018-05-02: 1 mg via ORAL
  Filled 2018-05-02: qty 1

## 2018-05-02 MED ORDER — LORAZEPAM 1 MG PO TABS: 1.0000 mg | ORAL_TABLET | Freq: Three times a day (TID) | ORAL | 0 refills | Status: DC | PRN

## 2018-05-02 NOTE — ED Triage Notes (Signed)
BIB EMS from home, pt reports onset of chest palpitations within past hr. Also reports dizziness and nausea. Pt recently seen here for same. Has hx of anxiety and has been under a lot of stress recently. VSS.

## 2018-05-02 NOTE — ED Provider Notes (Signed)
[facility] EMERGENCY DEPARTMENT Provider Note   CSN: [id number] Arrival date & time: 05/02/18  0008     History   Chief Complaint Chief Complaint  Patient presents with  . Chest Palpitations    HPI Summer Roberts is a 57 y.o. female.  The history is provided by the patient and medical records.    57 year old female with history of hypothyroidism, presenting to the ED with palpitations.  She was seen in the ED for same about 3 weeks ago but states she has been doing better since that time.  Today she had to drive from  for about 7 hours by herself in the car and was somewhat stressed about this.  States when she got home she felt a little fatigued and weak, checked her blood pressure and noticed that it was low.  States she drank a half a cup of coffee and it seemed to improve but then started getting palpitations, nervous feeling, and felt very "jittery".  States she got nervous and called her husband, then called EMS.  She was given aspirin nitroglycerin en route here.  States she does admit to some anxiety recently-- her husband is getting divorced but is concerned there is "something more".  She has no known cardiac history.  No hx of DVT or PE.  Aside from car ride today no recent travel-- does admit she stopped a few times today and took breaks.  Not currently on exogenous estrogens.  No recent surgeries.    Past Medical History:  Diagnosis Date  . Thyroid disease     Patient Active Problem List   Diagnosis Date Noted  . HYPOTHYROIDISM 05/21/2007    History reviewed. No pertinent surgical history.   OB History   None      Home Medications    Prior to Admission medications   Medication Sig Start Date End Date Taking? Authorizing Provider  acetaminophen (TYLENOL) 500 MG tablet Take 1,000 mg by mouth every 6 (six) hours as needed for mild pain.    [provider]  aspirin EC 81 MG tablet Take 81 mg by mouth daily as needed (for  pain or headaches).  03/18/09   [provider]  aspirin-acetaminophen-caffeine (EXCEDRIN MIGRAINE) [phone] MG tablet Take 2 tablets by mouth as needed (chest pain).    [provider]  famotidine (PEPCID) 40 MG tablet Take 1 tablet (40 mg total) by mouth 2 (two) times daily. 05/10/17 05/10/18  Marquette Saa, MD  ibuprofen (ADVIL,MOTRIN) 200 MG tablet Take 600 mg by mouth every 6 (six) hours as needed for headache (or pain).    [provider]  levothyroxine (SYNTHROID, LEVOTHROID) 125 MCG tablet Take 125 mcg by mouth daily before breakfast.  10/23/15 04/09/18  [provider]  losartan (COZAAR) 25 MG tablet Take 25 mg by mouth daily. 01/16/18   [provider]  naproxen sodium (ALEVE) 220 MG tablet Take 220 mg by mouth as needed (chest pain).    [provider]  VALERIAN ROOT PO Take 2 capsules by mouth 2 (two) times daily.    [provider]    Family History No family history on file.  Social History Social History   Tobacco Use  . Smoking status: Never Smoker  . Smokeless tobacco: Never Used  Substance Use Topics  . Alcohol use: No  . Drug use: Never     Allergies   Patient has no known allergies.   Review of Systems Review of Systems  Cardiovascular: Positive for palpitations.  All other systems reviewed and are negative.    Physical Exam Updated Vital Signs BP 123/62   Pulse 95   Temp 98.1 F (36.7 C) (Oral)   Resp 19   Ht 5\' 4"  (1.626 m)   Wt 81.2 kg   LMP  (LMP Unknown)   SpO2 94%   BMI 30.73 kg/m   Physical Exam  Constitutional: She is oriented to person, place, and time. She appears well-developed and well-nourished.  HENT:  Head: Normocephalic and atraumatic.  Mouth/Throat: Oropharynx is clear and moist.  Eyes: Pupils are equal, round, and reactive to light. Conjunctivae and EOM are normal.  Neck: Normal range of motion.  Cardiovascular: Normal rate, regular rhythm and normal  heart sounds.  HR 88 during exam, continues complaining of palpitations  Pulmonary/Chest: Effort normal and breath sounds normal.  Abdominal: Soft. Bowel sounds are normal.  Musculoskeletal: Normal range of motion.  Neurological: She is alert and oriented to person, place, and time.  Skin: Skin is warm and dry.  Psychiatric: Her mood appears anxious.  Appears anxious, started hyperventilating during exam  Nursing note and vitals reviewed.    ED Treatments / Results  Labs (all labs ordered are listed, but only abnormal results are displayed) Labs Reviewed  BASIC METABOLIC PANEL - Abnormal; Notable for the following components:      Result Value   Glucose, Bld 122 (*)    All other components within normal limits  CBC  I-STAT TROPONIN, ED  I-STAT BETA HCG BLOOD, ED ([facility], [facility] ONLY)    EKG EKG Interpretation  Date/Time:  Wednesday May 02 2018 00:18:59 EDT Ventricular Rate:  94 PR Interval:    QRS Duration: 89 QT Interval:  365 QTC Calculation: 457 R Axis:   -21 Text Interpretation:  Sinus rhythm Borderline left axis deviation Confirmed by Zadie RhineWickline, Donald ([id number]) on 05/02/2018 12:22:02 AM   Radiology Dg Chest 2 View  Result Date: 05/02/2018 CLINICAL DATA:  Hypertension.  Chest pain today. EXAM: CHEST - 2 VIEW COMPARISON:  04/09/2018 FINDINGS: The heart size and mediastinal contours are within normal limits. Both lungs are clear. The visualized skeletal structures are unremarkable. IMPRESSION: No active cardiopulmonary disease. Electronically Signed   By: Burman NievesWilliam  Stevens M.D.   On: 05/02/2018 00:56    Procedures Procedures (including critical care time)  Medications Ordered in ED Medications  LORazepam (ATIVAN) tablet 1 mg (has no administration in time range)     Initial Impression / Assessment and Plan / ED Course  I have reviewed the triage vital signs and the nursing notes.  Pertinent labs & imaging results that were available during my care of the  patient were reviewed by me and considered in my medical decision making (see chart for details).  57 year old female here with palpitations.  Seen for same recently in the ED with negative work-up.  She is afebrile and nontoxic.  She does appear somewhat anxious on exam and begins hyperventilating when talking about her symptoms.  Her vital signs are stable, heart rate is normal in the 80s during exam.  EKG sinus rhythm without acute ischemic changes.  Labs are overall reassuring.  Chest x-ray is negative.  Patient treated here with dose of Ativan and reports resolution of her symptoms.   She did have a long car ride today which she states was somewhat stressful but does report stopping for breaks.  Aside from this she has no other significant risk factors for PE, PERC negative, and  had negative CTA of the chest on 04/09/2018.  She remains without any tachycardia or hypoxia.  Symptoms are atypical for ACS, suspect there is likely anxiety/stress component to this which I discussed with patient and her husband.  She did request some Ativan for home use as she responded very well to this in the ED.  Feel this is reasonable.  We will have her follow-up with primary care.  She will return here for any new or worsening symptoms.  Final Clinical Impressions(s) / ED Diagnoses   Final diagnoses:  Palpitations    ED Discharge Orders         Ordered    LORazepam (ATIVAN) 1 MG tablet  3 times daily PRN     05/02/18 0243           Garlon Hatchet, PA-C 05/02/18 9562    Zadie Rhine, MD 05/02/18 [phone]

## 2018-05-02 NOTE — Discharge Instructions (Signed)
Take the prescribed medication as directed.  If you are feeling ok and not having any symptoms, you do not have to take this. Follow-up with a primary care doctor in the area.  You can try to see the patient care center, or call the 800 number on paperwork for help finding other clinic. Return to the ED for new or worsening symptoms.

## 2018-05-02 NOTE — ED Notes (Signed)
E-signature not available, pt verbalized understanding of DC instructions and prescriptions 

## 2019-01-08 HISTORY — PX: LAPAROSCOPIC CHOLECYSTECTOMY: SUR755

## 2020-03-26 ENCOUNTER — Ambulatory Visit
Admission: RE | Admit: 2020-03-26 | Discharge: 2020-03-26 | Disposition: A | Discharge status: Home / Self Care | Payer: 59 | Source: Ambulatory Visit | Attending: Emergency Medicine | Admitting: Emergency Medicine

## 2020-03-26 ENCOUNTER — Ambulatory Visit (INDEPENDENT_AMBULATORY_CARE_PROVIDER_SITE_OTHER): Payer: 59

## 2020-03-26 ENCOUNTER — Other Ambulatory Visit: Payer: Self-pay

## 2020-03-26 VITALS — BP 110/72 | HR 96 | Temp 98.9°F | Resp 20

## 2020-03-26 DIAGNOSIS — R059 Cough, unspecified: Secondary | ICD-10-CM

## 2020-03-26 DIAGNOSIS — R05 Cough: Secondary | ICD-10-CM | POA: Diagnosis not present

## 2020-03-26 DIAGNOSIS — J209 Acute bronchitis, unspecified: Secondary | ICD-10-CM

## 2020-03-26 MED ORDER — PREDNISONE 20 MG PO TABS
20.0000 mg | ORAL_TABLET | Freq: Two times a day (BID) | ORAL | 0 refills | Status: AC
Start: 2020-03-26 — End: 2020-03-31

## 2020-03-26 MED ORDER — BENZONATATE 100 MG PO CAPS
100.0000 mg | ORAL_CAPSULE | Freq: Three times a day (TID) | ORAL | 0 refills | Status: DC
Start: 2020-03-26 — End: 2020-03-29

## 2020-03-26 NOTE — ED Triage Notes (Signed)
Pt report  fever that started Sunday and developed slight cough this morning . Took 3 motrin this morning

## 2020-03-26 NOTE — Discharge Instructions (Signed)
Has covid test scheduled today Chest x-ray negative for pneumonia, suspicious for bronchitis  In the meantime: You should remain isolated in your home for 10 days from symptom onset AND greater than 72 hours after symptoms resolution (absence of fever without the use of fever-reducing medication and improvement in respiratory symptoms), whichever is longer Get plenty of rest and push fluids Tessalon Perles prescribed for cough Prednisone prescribed for bronchitis.   Use OTC zyrtec for nasal congestion, runny nose, and/or sore throat Use OTC flonase for nasal congestion and runny nose Use medications daily for symptom relief Use OTC medications like ibuprofen or tylenol as needed fever or pain Call or go to the ED if you have any new or worsening symptoms such as fever, worsening cough, shortness of breath, chest tightness, chest pain, turning blue, changes in mental status, etc..Marland Kitchen

## 2020-03-26 NOTE — ED Provider Notes (Signed)
[facility]   [id number] 03/26/20 Arrival Time: 1026   CC: COVID symptoms  SUBJECTIVE: History from: patient.  Summer Roberts is a 59 y.o. female who presents with fever, tmax 100 at home, and non-productive cough x 4 days.  Reports son with cold symptoms.  Denies alleviating or aggravating factors.  Denies previous symptoms in the past.   Denies sinus pain, rhinorrhea, sore throat, SOB, wheezing, chest pain, nausea, changes in bowel or bladder habits.     ROS: As per HPI.  All other pertinent ROS negative.     Past Medical History:  Diagnosis Date  . Thyroid disease    History reviewed. No pertinent surgical history. No Known Allergies No current facility-administered medications on file prior to encounter.   Current Outpatient Medications on File Prior to Encounter  Medication Sig Dispense Refill  . acetaminophen (TYLENOL) 500 MG tablet Take 1,000 mg by mouth every 6 (six) hours as needed for mild pain.    Marland Kitchen aspirin EC 81 MG tablet Take 81 mg by mouth daily as needed (for pain or headaches).     Marland Kitchen aspirin-acetaminophen-caffeine (EXCEDRIN MIGRAINE) 250-250-65 MG tablet Take 2 tablets by mouth as needed (chest pain).    . famotidine (PEPCID) 40 MG tablet Take 1 tablet (40 mg total) by mouth 2 (two) times daily. 180 tablet 0  . ibuprofen (ADVIL,MOTRIN) 200 MG tablet Take 600 mg by mouth every 6 (six) hours as needed for headache (or pain).    Marland Kitchen levothyroxine (SYNTHROID, LEVOTHROID) 125 MCG tablet Take 125 mcg by mouth daily before breakfast.     . LORazepam (ATIVAN) 1 MG tablet Take 1 tablet (1 mg total) by mouth 3 (three) times daily as needed for anxiety. 15 tablet 0  . naproxen sodium (ALEVE) 220 MG tablet Take 220 mg by mouth as needed (chest pain).    Gerarda Fraction ROOT PO Take 2 capsules by mouth 2 (two) times daily.    . [DISCONTINUED] losartan (COZAAR) 25 MG tablet Take 25 mg by mouth daily.  11   Social History   Socioeconomic History  . Marital status: Married      Spouse name: Not on file  . Number of children: Not on file  . Years of education: Not on file  . Highest education level: Not on file  Occupational History  . Not on file  Tobacco Use  . Smoking status: Never Smoker  . Smokeless tobacco: Never Used  Substance and Sexual Activity  . Alcohol use: No  . Drug use: Never  . Sexual activity: Not on file  Other Topics Concern  . Not on file  Social History Narrative  . Not on file   Social Determinants of Health   Financial Resource Strain:   . Difficulty of Paying Living Expenses:   Food Insecurity:   . Worried About Programme researcher, broadcasting/film/video in the Last Year:   . Barista in the Last Year:   Transportation Needs:   . Freight forwarder (Medical):   Marland Kitchen Lack of Transportation (Non-Medical):   Physical Activity:   . Days of Exercise per Week:   . Minutes of Exercise per Session:   Stress:   . Feeling of Stress :   Social Connections:   . Frequency of Communication with Friends and Family:   . Frequency of Social Gatherings with Friends and Family:   . Attends Religious Services:   . Active Member of Clubs or Organizations:   . Attends Club  or Organization Meetings:   Marland Kitchen Marital Status:   Intimate Partner Violence:   . Fear of Current or Ex-Partner:   . Emotionally Abused:   Marland Kitchen Physically Abused:   . Sexually Abused:    Family History  Family history unknown: Yes    OBJECTIVE:  Vitals:   03/26/20 1052  BP: 110/72  Pulse: 96  Resp: 20  Temp: 98.9 F (37.2 C)  SpO2: 95%     General appearance: alert; appears mildly fatigued, but nontoxic; speaking in full sentences and tolerating own secretions HEENT: NCAT; Ears: EACs clear, TMs pearly gray; Eyes: PERRL.  EOM grossly intact. Nose: nares patent without rhinorrhea, Throat: oropharynx clear, tonsils non erythematous or enlarged, uvula midline  Neck: supple without LAD Lungs: unlabored respirations, symmetrical air entry; cough: mild; no respiratory distress;  CTAB Heart: regular rate and rhythm.   Skin: warm and dry Psychological: alert and cooperative; normal mood and affect  DIAGNOSTIC STUDIES:  DG Chest 2 View  Result Date: 03/26/2020 CLINICAL DATA:  Cough for 2 days EXAM: CHEST - 2 VIEW COMPARISON:  05/02/2018 FINDINGS: Cardiac shadow is stable. Lungs are well aerated bilaterally. Mild increased interstitial markings are noted without focal infiltrate or sizable effusion. No bony abnormality is noted. IMPRESSION: Increased interstitial markings likely related to bronchitis. Electronically Signed   By: Alcide Clever M.D.   On: 03/26/2020 11:26    X-rays concerning for bronchitis  I have reviewed the x-rays myself and the radiologist interpretation. I am in agreement with the radiologist interpretation.    ASSESSMENT & PLAN:  1. Cough   2. Acute bronchitis, unspecified organism     Meds ordered this encounter  Medications  . predniSONE (DELTASONE) 20 MG tablet    Sig: Take 1 tablet (20 mg total) by mouth 2 (two) times daily with a meal for 5 days.    Dispense:  10 tablet    Refill:  0    Order Specific Question:   Supervising Provider    Answer:   Eustace Moore [[id number]]  . benzonatate (TESSALON) 100 MG capsule    Sig: Take 1 capsule (100 mg total) by mouth every 8 (eight) hours.    Dispense:  21 capsule    Refill:  0    Order Specific Question:   Supervising Provider    Answer:   Eustace Moore [[id number]]   Has covid test scheduled today Chest x-ray negative for pneumonia, suspicious for bronchitis  In the meantime: You should remain isolated in your home for 10 days from symptom onset AND greater than 72 hours after symptoms resolution (absence of fever without the use of fever-reducing medication and improvement in respiratory symptoms), whichever is longer Get plenty of rest and push fluids Tessalon Perles prescribed for cough Prednisone prescribed for bronchitis.   Use OTC zyrtec for nasal congestion, runny nose,  and/or sore throat Use OTC flonase for nasal congestion and runny nose Use medications daily for symptom relief Use OTC medications like ibuprofen or tylenol as needed fever or pain Call or go to the ED if you have any new or worsening symptoms such as fever, worsening cough, shortness of breath, chest tightness, chest pain, turning blue, changes in mental status, etc...   Reviewed expectations re: course of current medical issues. Questions answered. Outlined signs and symptoms indicating need for more acute intervention. Patient verbalized understanding. After Visit Summary given.         Rennis Harding, PA-C 03/26/20 1142

## 2020-03-29 ENCOUNTER — Ambulatory Visit
Admission: EM | Admit: 2020-03-29 | Discharge: 2020-03-29 | Disposition: A | Discharge status: Home / Self Care | Payer: 59 | Attending: Emergency Medicine | Admitting: Emergency Medicine

## 2020-03-29 ENCOUNTER — Ambulatory Visit (INDEPENDENT_AMBULATORY_CARE_PROVIDER_SITE_OTHER): Payer: 59

## 2020-03-29 ENCOUNTER — Encounter: Payer: Self-pay | Admitting: Emergency Medicine

## 2020-03-29 ENCOUNTER — Other Ambulatory Visit: Payer: Self-pay

## 2020-03-29 DIAGNOSIS — J189 Pneumonia, unspecified organism: Secondary | ICD-10-CM | POA: Diagnosis not present

## 2020-03-29 DIAGNOSIS — R05 Cough: Secondary | ICD-10-CM | POA: Diagnosis not present

## 2020-03-29 DIAGNOSIS — R0602 Shortness of breath: Secondary | ICD-10-CM | POA: Diagnosis not present

## 2020-03-29 DIAGNOSIS — Z1152 Encounter for screening for COVID-19: Secondary | ICD-10-CM

## 2020-03-29 MED ORDER — BENZONATATE 100 MG PO CAPS
100.0000 mg | ORAL_CAPSULE | Freq: Three times a day (TID) | ORAL | 0 refills | Status: DC
Start: 2020-03-29 — End: 2022-10-21

## 2020-03-29 MED ORDER — AZITHROMYCIN 250 MG PO TABS
250.0000 mg | ORAL_TABLET | Freq: Every day | ORAL | 0 refills | Status: DC
Start: 2020-03-29 — End: 2020-04-05

## 2020-03-29 NOTE — Discharge Instructions (Signed)
Covid test will take about 2-7 days for results to return Get plenty of rest and push fluids Continue to take prednisone and Tessalon Perles as prescribed Azithromycin to be prescribed for possible bronchitis Use medications daily for symptom relief Use OTC medications like ibuprofen or tylenol as needed fever or pain Follow-up here or with PCP in 4 weeks to have the x-ray completed Call or go to the ED if you have any new or worsening symptoms such as fever, worsening cough, shortness of breath, chest tightness, chest pain, turning blue, changes in mental status, etc..Marland Kitchen

## 2020-03-29 NOTE — ED Provider Notes (Addendum)
[facility]   [id number] 03/29/20 Arrival Time: 0820   CC: SOB  SUBJECTIVE: History from: patient.  Summer Roberts is a 59 y.o. female who presented to the urgent care with a complaint of fever, fever, fatigue, night sweats, SOB  for the past 7 days.  Denies sick exposure to COVID, flu or strep.  Denies recent travel.  Has tried prednisone and benzonatate with mild relief.  Denies aggravating factors.  Denies previous symptoms in the past.   Denies sinus pain, rhinorrhea, sore throat,  wheezing, chest pain, nausea, changes in bowel or bladder habits.    ROS: As per HPI.  All other pertinent ROS negative.     Past Medical History:  Diagnosis Date  . Thyroid disease    History reviewed. No pertinent surgical history. No Known Allergies No current facility-administered medications on file prior to encounter.   Current Outpatient Medications on File Prior to Encounter  Medication Sig Dispense Refill  . acetaminophen (TYLENOL) 500 MG tablet Take 1,000 mg by mouth every 6 (six) hours as needed for mild pain.    Marland Kitchen aspirin EC 81 MG tablet Take 81 mg by mouth daily as needed (for pain or headaches).     Marland Kitchen aspirin-acetaminophen-caffeine (EXCEDRIN MIGRAINE) 250-250-65 MG tablet Take 2 tablets by mouth as needed (chest pain).    . benzonatate (TESSALON) 100 MG capsule Take 1 capsule (100 mg total) by mouth every 8 (eight) hours. 21 capsule 0  . famotidine (PEPCID) 40 MG tablet Take 1 tablet (40 mg total) by mouth 2 (two) times daily. 180 tablet 0  . ibuprofen (ADVIL,MOTRIN) 200 MG tablet Take 600 mg by mouth every 6 (six) hours as needed for headache (or pain).    Marland Kitchen levothyroxine (SYNTHROID, LEVOTHROID) 125 MCG tablet Take 125 mcg by mouth daily before breakfast.     . LORazepam (ATIVAN) 1 MG tablet Take 1 tablet (1 mg total) by mouth 3 (three) times daily as needed for anxiety. 15 tablet 0  . naproxen sodium (ALEVE) 220 MG tablet Take 220 mg by mouth as needed (chest pain).    .  predniSONE (DELTASONE) 20 MG tablet Take 1 tablet (20 mg total) by mouth 2 (two) times daily with a meal for 5 days. 10 tablet 0  . VALERIAN ROOT PO Take 2 capsules by mouth 2 (two) times daily.    . [DISCONTINUED] losartan (COZAAR) 25 MG tablet Take 25 mg by mouth daily.  11   Social History   Socioeconomic History  . Marital status: Married    Spouse name: Not on file  . Number of children: Not on file  . Years of education: Not on file  . Highest education level: Not on file  Occupational History  . Not on file  Tobacco Use  . Smoking status: Never Smoker  . Smokeless tobacco: Never Used  Substance and Sexual Activity  . Alcohol use: No  . Drug use: Never  . Sexual activity: Not on file  Other Topics Concern  . Not on file  Social History Narrative  . Not on file   Social Determinants of Health   Financial Resource Strain:   . Difficulty of Paying Living Expenses:   Food Insecurity:   . Worried About Programme researcher, broadcasting/film/video in the Last Year:   . Barista in the Last Year:   Transportation Needs:   . Freight forwarder (Medical):   Marland Kitchen Lack of Transportation (Non-Medical):   Physical Activity:   .  Days of Exercise per Week:   . Minutes of Exercise per Session:   Stress:   . Feeling of Stress :   Social Connections:   . Frequency of Communication with Friends and Family:   . Frequency of Social Gatherings with Friends and Family:   . Attends Religious Services:   . Active Member of Clubs or Organizations:   . Attends Banker Meetings:   Marland Kitchen Marital Status:   Intimate Partner Violence:   . Fear of Current or Ex-Partner:   . Emotionally Abused:   Marland Kitchen Physically Abused:   . Sexually Abused:    Family History  Family history unknown: Yes    OBJECTIVE:  Vitals:   03/29/20 0832 03/29/20 0839  BP: 126/79   Pulse: (!) 101   Resp: 16   Temp: 99.3 F (37.4 C)   TempSrc: Oral   SpO2: 94%   Weight:  178 lb 9.2 oz (81 kg)  Height:  5\' 4"  (1.626  m)     General appearance: alert; appears fatigued, but nontoxic; speaking in full sentences and tolerating own secretions HEENT: NCAT; Ears: EACs clear, TMs pearly gray; Eyes: PERRL.  EOM grossly intact. Sinuses: nontender; Nose: nares patent without rhinorrhea, Throat: oropharynx clear, tonsils non erythematous or enlarged, uvula midline  Neck: supple without LAD Lungs: unlabored respirations, symmetrical air entry; cough: mild; no respiratory distress; CTAB Heart: regular rate and rhythm.  Radial pulses 2+ symmetrical bilaterally Skin: warm and dry Psychological: alert and cooperative; normal mood and affect  LABS:  No results found for this or any previous visit (from the past 24 hour(s)).   RADIOLOGY DG Chest 2 View  Result Date: 03/29/2020 CLINICAL DATA:  Patient with worsening shortness of breath.  Cough. EXAM: CHEST - 2 VIEW COMPARISON:  Chest radiograph 03/26/2020 FINDINGS: Interval development of retrocardiac consolidation. Stable cardiac and mediastinal contours. No pleural effusion or pneumothorax. Unremarkable osseous structures. IMPRESSION: New retrocardiac consolidation concerning for pneumonia. Followup PA and lateral chest X-ray is recommended in 3-4 weeks following trial of antibiotic therapy to ensure resolution and exclude underlying malignancy. Electronically Signed   By: 05/26/2020 M.D.   On: 03/29/2020 09:05    ASSESSMENT & PLAN:  1. Shortness of breath   2. Encounter for screening for COVID-19   3. Pneumonia of both lungs due to infectious organism, unspecified part of lung     Meds ordered this encounter  Medications  . azithromycin (ZITHROMAX) 250 MG tablet    Sig: Take 1 tablet (250 mg total) by mouth daily. Take first 2 tablets together, then 1 every day until finished.    Dispense:  6 tablet    Refill:  0   Patient is stable at discharge. Chest x-ray x-ray is positive for possible pneumonia.  Follow-up x-ray is recommended in 4 weeks.  I have reviewed  the x-ray myself and the radiologist interpretation.  I am in agreement with the radiologist interpretation.   Discharge Instructions Covid test will take about 2-7 days for results to return Get plenty of rest and push fluids Continue to take prednisone and Tessalon Perles as prescribed Azithromycin to be prescribed for possible bronchitis Use medications daily for symptom relief Use OTC medications like ibuprofen or tylenol as needed fever or pain Follow-up here or with PCP in 4 weeks to have the x-ray completed Call or go to the ED if you have any new or worsening symptoms such as fever, worsening cough, shortness of breath, chest tightness, chest pain,  turning blue, changes in mental status, etc...   Reviewed expectations re: course of current medical issues. Questions answered. Outlined signs and symptoms indicating need for more acute intervention. Patient verbalized understanding. After Visit Summary given.     Note: This document was prepared using Dragon voice recognition software and may include unintentional dictation errors.     Durward Parcel, FNP 03/29/20 0916    Durward Parcel, FNP 03/29/20 [phone]

## 2020-03-29 NOTE — ED Triage Notes (Addendum)
Fatigue, night sweats, chills and low grade fever.  Pt worried she may have pne and would like another chest xray

## 2020-03-30 LAB — SARS-COV-2, NAA 2 DAY TAT

## 2020-03-30 LAB — NOVEL CORONAVIRUS, NAA: SARS-CoV-2, NAA: DETECTED — AB

## 2020-04-02 ENCOUNTER — Inpatient Hospital Stay (HOSPITAL_COMMUNITY): Payer: 59

## 2020-04-02 ENCOUNTER — Encounter (HOSPITAL_COMMUNITY): Payer: Self-pay | Admitting: Internal Medicine

## 2020-04-02 ENCOUNTER — Inpatient Hospital Stay (HOSPITAL_COMMUNITY)
Admission: EM | Admit: 2020-04-02 | Discharge: 2020-04-05 | DRG: 177 | Disposition: A | Discharge status: Home / Self Care | Payer: 59 | Attending: Internal Medicine | Admitting: Internal Medicine

## 2020-04-02 ENCOUNTER — Emergency Department (HOSPITAL_COMMUNITY): Payer: 59

## 2020-04-02 DIAGNOSIS — E860 Dehydration: Secondary | ICD-10-CM | POA: Diagnosis present

## 2020-04-02 DIAGNOSIS — Z79899 Other long term (current) drug therapy: Secondary | ICD-10-CM | POA: Diagnosis not present

## 2020-04-02 DIAGNOSIS — Z8249 Family history of ischemic heart disease and other diseases of the circulatory system: Secondary | ICD-10-CM

## 2020-04-02 DIAGNOSIS — J9601 Acute respiratory failure with hypoxia: Secondary | ICD-10-CM | POA: Diagnosis present

## 2020-04-02 DIAGNOSIS — J1282 Pneumonia due to coronavirus disease 2019: Secondary | ICD-10-CM | POA: Diagnosis present

## 2020-04-02 DIAGNOSIS — U071 COVID-19: Secondary | ICD-10-CM | POA: Diagnosis present

## 2020-04-02 DIAGNOSIS — Z809 Family history of malignant neoplasm, unspecified: Secondary | ICD-10-CM | POA: Diagnosis not present

## 2020-04-02 DIAGNOSIS — Z7982 Long term (current) use of aspirin: Secondary | ICD-10-CM | POA: Diagnosis not present

## 2020-04-02 DIAGNOSIS — Z79890 Hormone replacement therapy (postmenopausal): Secondary | ICD-10-CM | POA: Diagnosis not present

## 2020-04-02 DIAGNOSIS — E039 Hypothyroidism, unspecified: Secondary | ICD-10-CM | POA: Diagnosis present

## 2020-04-02 LAB — HEPATIC FUNCTION PANEL
ALT: 20 U/L (ref 0–44)
AST: 25 U/L (ref 15–41)
Albumin: 3.5 g/dL (ref 3.5–5.0)
Alkaline Phosphatase: 63 U/L (ref 38–126)
Bilirubin, Direct: 0.1 mg/dL (ref 0.0–0.2)
Indirect Bilirubin: 0.8 mg/dL (ref 0.3–0.9)
Total Bilirubin: 0.9 mg/dL (ref 0.3–1.2)
Total Protein: 7.4 g/dL (ref 6.5–8.1)

## 2020-04-02 LAB — BASIC METABOLIC PANEL
Anion gap: 10 (ref 5–15)
BUN: <5 mg/dL — ABNORMAL LOW (ref 6–20)
CO2: 23 mmol/L (ref 22–32)
Calcium: 8.7 mg/dL — ABNORMAL LOW (ref 8.9–10.3)
Chloride: 102 mmol/L (ref 98–111)
Creatinine, Ser: 0.62 mg/dL (ref 0.44–1.00)
GFR calc Af Amer: >60 mL/min (ref >60)
GFR calc non Af Amer: >60 mL/min (ref >60)
Glucose, Bld: 116 mg/dL — ABNORMAL HIGH (ref 70–99)
Potassium: 3.6 mmol/L (ref 3.5–5.1)
Sodium: 135 mmol/L (ref 135–145)

## 2020-04-02 LAB — CBC
HCT: 41.7 % (ref 36.0–46.0)
Hemoglobin: 14.6 g/dL (ref 12.0–15.0)
MCH: 31.3 pg (ref 26.0–34.0)
MCHC: 35 g/dL (ref 30.0–36.0)
MCV: 89.3 fL (ref 80.0–100.0)
Platelets: 238 10*3/uL (ref 150–400)
RBC: 4.67 MIL/uL (ref 3.87–5.11)
RDW: 12.9 % (ref 11.5–15.5)
WBC: 6.1 10*3/uL (ref 4.0–10.5)
nRBC: 0 % (ref 0.0–0.2)

## 2020-04-02 LAB — PROCALCITONIN: Procalcitonin: <0.1 ng/mL

## 2020-04-02 LAB — ABO/RH: ABO/RH(D): A NEG

## 2020-04-02 LAB — FIBRINOGEN: Fibrinogen: 601 mg/dL — ABNORMAL HIGH (ref 210–475)

## 2020-04-02 LAB — FERRITIN: Ferritin: 392 ng/mL — ABNORMAL HIGH (ref 11–307)

## 2020-04-02 LAB — LACTIC ACID, PLASMA: Lactic Acid, Venous: 1.5 mmol/L (ref 0.5–1.9)

## 2020-04-02 LAB — LACTATE DEHYDROGENASE: LDH: 332 U/L — ABNORMAL HIGH (ref 98–192)

## 2020-04-02 LAB — TRIGLYCERIDES: Triglycerides: 102 mg/dL (ref <150)

## 2020-04-02 LAB — D-DIMER, QUANTITATIVE: D-Dimer, Quant: 1.18 ug/mL-FEU — ABNORMAL HIGH (ref 0.00–0.50)

## 2020-04-02 LAB — TSH: TSH: 2.345 u[IU]/mL (ref 0.350–4.500)

## 2020-04-02 LAB — C-REACTIVE PROTEIN: CRP: 5.7 mg/dL — ABNORMAL HIGH (ref <1.0)

## 2020-04-02 MED ORDER — ACETAMINOPHEN 325 MG PO TABS
650.0000 mg | ORAL_TABLET | Freq: Four times a day (QID) | ORAL | Status: DC | PRN
  Administered 2020-04-04: 650 mg via ORAL
  Filled 2020-04-02: qty 2

## 2020-04-02 MED ORDER — ZINC SULFATE 220 (50 ZN) MG PO CAPS
220.0000 mg | ORAL_CAPSULE | Freq: Every day | ORAL | Status: DC
  Administered 2020-04-02 – 2020-04-05 (×4): 220 mg via ORAL
  Filled 2020-04-02 (×4): qty 1

## 2020-04-02 MED ORDER — ASCORBIC ACID 500 MG PO TABS
500.0000 mg | ORAL_TABLET | Freq: Every day | ORAL | Status: DC
  Administered 2020-04-02 – 2020-04-05 (×4): 500 mg via ORAL
  Filled 2020-04-02 (×4): qty 1

## 2020-04-02 MED ORDER — IOHEXOL 300 MG/ML  SOLN
100.0000 mL | Freq: Once | INTRAMUSCULAR | Status: AC | PRN
  Administered 2020-04-02: 100 mL via INTRAVENOUS

## 2020-04-02 MED ORDER — SODIUM CHLORIDE 0.9% FLUSH
3.0000 mL | Freq: Two times a day (BID) | INTRAVENOUS | Status: DC
  Administered 2020-04-02 – 2020-04-05 (×6): 3 mL via INTRAVENOUS

## 2020-04-02 MED ORDER — LACTATED RINGERS IV BOLUS
1000.0000 mL | Freq: Once | INTRAVENOUS | Status: AC
  Administered 2020-04-02: 1000 mL via INTRAVENOUS

## 2020-04-02 MED ORDER — LACTATED RINGERS IV SOLN: INTRAVENOUS | Status: AC

## 2020-04-02 MED ORDER — ONDANSETRON HCL 4 MG/2ML IJ SOLN: 4.0000 mg | Freq: Four times a day (QID) | INTRAMUSCULAR | Status: DC | PRN

## 2020-04-02 MED ORDER — ENOXAPARIN SODIUM 40 MG/0.4ML ~~LOC~~ SOLN
40.0000 mg | Freq: Every day | SUBCUTANEOUS | Status: DC
  Administered 2020-04-02 – 2020-04-04 (×3): 40 mg via SUBCUTANEOUS
  Filled 2020-04-02 (×3): qty 0.4

## 2020-04-02 MED ORDER — SODIUM CHLORIDE 0.9 % IV SOLN
100.0000 mg | Freq: Every day | INTRAVENOUS | Status: DC
  Administered 2020-04-03 – 2020-04-05 (×3): 100 mg via INTRAVENOUS
  Filled 2020-04-02 (×3): qty 20

## 2020-04-02 MED ORDER — GUAIFENESIN-DM 100-10 MG/5ML PO SYRP: 10.0000 mL | ORAL_SOLUTION | ORAL | Status: DC | PRN

## 2020-04-02 MED ORDER — SODIUM CHLORIDE 0.9 % IV SOLN
200.0000 mg | Freq: Once | INTRAVENOUS | Status: AC
  Administered 2020-04-02: 200 mg via INTRAVENOUS
  Filled 2020-04-02: qty 40

## 2020-04-02 MED ORDER — ALBUTEROL SULFATE HFA 108 (90 BASE) MCG/ACT IN AERS
2.0000 | INHALATION_SPRAY | Freq: Four times a day (QID) | RESPIRATORY_TRACT | Status: DC
  Administered 2020-04-02 – 2020-04-05 (×9): 2 via RESPIRATORY_TRACT
  Filled 2020-04-02: qty 6.7

## 2020-04-02 MED ORDER — ONDANSETRON HCL 4 MG PO TABS: 4.0000 mg | ORAL_TABLET | Freq: Four times a day (QID) | ORAL | Status: DC | PRN

## 2020-04-02 MED ORDER — DEXAMETHASONE SODIUM PHOSPHATE 10 MG/ML IJ SOLN
6.0000 mg | Freq: Every day | INTRAMUSCULAR | Status: DC
  Administered 2020-04-02 – 2020-04-04 (×3): 6 mg via INTRAVENOUS
  Filled 2020-04-02 (×3): qty 1

## 2020-04-02 MED ORDER — LEVOTHYROXINE SODIUM 25 MCG PO TABS
125.0000 ug | ORAL_TABLET | Freq: Every day | ORAL | Status: DC
  Administered 2020-04-03 – 2020-04-05 (×3): 125 ug via ORAL
  Filled 2020-04-02 (×3): qty 1

## 2020-04-02 MED ORDER — LACTATED RINGERS IV BOLUS
500.0000 mL | Freq: Once | INTRAVENOUS | Status: AC
  Administered 2020-04-02: 500 mL via INTRAVENOUS

## 2020-04-02 MED ORDER — HYDROCOD POLST-CPM POLST ER 10-8 MG/5ML PO SUER: 5.0000 mL | Freq: Two times a day (BID) | ORAL | Status: DC | PRN

## 2020-04-02 MED ORDER — SALINE SPRAY 0.65 % NA SOLN
1.0000 | NASAL | Status: DC
  Administered 2020-04-03: 2 via NASAL
  Administered 2020-04-03: 1 via NASAL
  Administered 2020-04-03 – 2020-04-04 (×8): 2 via NASAL
  Administered 2020-04-04: 1 via NASAL
  Administered 2020-04-04 (×5): 2 via NASAL
  Administered 2020-04-04: 1 via NASAL
  Administered 2020-04-04: 2 via NASAL
  Administered 2020-04-04 – 2020-04-05 (×6): 1 via NASAL
  Filled 2020-04-02: qty 44

## 2020-04-02 NOTE — ED Notes (Signed)
Please call pt son Summer Roberts  for an update [phone]

## 2020-04-02 NOTE — ED Notes (Signed)
Got patient on the pmonitor into a gown pATIENT IS RESTING WITH CALL BELL IN REACH

## 2020-04-02 NOTE — H&P (Signed)
History and Physical    Summer Roberts QPY:[id number] DOB: May 01, 1961 DOA: 04/02/2020  PCP: Kathyrn Sheriff, MD  Patient coming from: Home via EMS  I have personally briefly reviewed patient's old medical records in [facility]  Chief Complaint: Generalized weakness, positive COVID-19 test  HPI: Summer Roberts is a 59 y.o. female with medical history significant for hypothyroidism and radiation exposure from proximity to [place] disaster (age 22, approximately 300 km away) who presents to the ED for evaluation of progressive generalized weakness, chills, cough.  Patient was initially seen at the urgent care in [place] on 03/26/2020 with complaints of fever and nonproductive cough beginning about 12 days ago.  2 view chest x-ray showed increased interstitial markings.  Patient was treated for suspected bronchitis with a course of prednisone.  She returned to [facility] urgent care on 03/29/2020 with complaint of fever, fatigue, night sweats, SOB, and chills.  Repeat two-view chest x-ray showed new retrocardiac consolidation concerning for pneumonia.  She was started on a course of azithromycin pending SARS-CoV-2 testing which did result positive and patient was notified of the test result yesterday (04/01/2020).  Patient states she initially started to feel better with fever breaking 2 days ago after starting azithromycin however over the last day has had worsening generalized weakness, fatigue, poor appetite and oral intake, shortness of breath, dry cough, chills, and diaphoresis.  She says this morning she became very lightheaded and fell to the ground.  He came to the ED for further evaluation.  ED Course:  Initial vitals showed BP 116/69, pulse 106, RR 18, temp 99.4 Fahrenheit, SPO2 97% on room air.  Labs notable for sodium 135, potassium 3.6, bicarb 23, BUN <5, creatinine 0.62, serum glucose 116, WBC 6.1, hemoglobin 14.6, platelets 238,000, lactic acid 1.5, procalcitonin <0.10, LDH 332,  D-dimer 1.18, ferritin 392, fibrinogen 601, CRP 5.7, hepatic function panel within normal limits.  Blood culture was ordered and pending.  Portable chest x-ray shows left midlung airspace disease, new when compared to most recent chest x-ray.  While in the ED patient was noted to have desaturation her SPO2 to 87-89%.  Patient was given 500 cc LR.  CTA chest PE study was ordered and pending.  The hospitalist service was consulted to admit for further evaluation and management.  Review of Systems: All systems reviewed and are negative except as documented in history of present illness above.   Past Medical History:  Diagnosis Date  . Thyroid disease     Past Surgical History:  Procedure Laterality Date  . LAPAROSCOPIC CHOLECYSTECTOMY  01/08/2019    Social History:  reports that she has never smoked. She has never used smokeless tobacco. She reports that she does not drink alcohol and does not use drugs.  No Known Allergies  Family History  Problem Relation Age of Onset  . Cancer Mother   . Hypertension Maternal Grandmother   . Hypertension Paternal Grandfather      Prior to Admission medications   Medication Sig Start Date End Date Taking? Authorizing Provider  acetaminophen (TYLENOL) 500 MG tablet Take 1,000 mg by mouth every 6 (six) hours as needed for mild pain.    [provider]  aspirin EC 81 MG tablet Take 81 mg by mouth daily as needed (for pain or headaches).  03/18/09   [provider]  aspirin-acetaminophen-caffeine (EXCEDRIN MIGRAINE) [phone] MG tablet Take 2 tablets by mouth as needed (chest pain).    [provider]  azithromycin (ZITHROMAX) 250 MG tablet Take 1  tablet (250 mg total) by mouth daily. Take first 2 tablets together, then 1 every day until finished. 03/29/20   Avegno, Zachery Dakins, FNP  benzonatate (TESSALON) 100 MG capsule Take 1 capsule (100 mg total) by mouth every 8 (eight) hours. 03/29/20   Avegno, Zachery Dakins, FNP   famotidine (PEPCID) 40 MG tablet Take 1 tablet (40 mg total) by mouth 2 (two) times daily. 05/10/17 05/10/18  Marquette Saa, MD  ibuprofen (ADVIL,MOTRIN) 200 MG tablet Take 600 mg by mouth every 6 (six) hours as needed for headache (or pain).    [provider]  levothyroxine (SYNTHROID, LEVOTHROID) 125 MCG tablet Take 125 mcg by mouth daily before breakfast.  10/23/15 04/09/18  [provider]  LORazepam (ATIVAN) 1 MG tablet Take 1 tablet (1 mg total) by mouth 3 (three) times daily as needed for anxiety. 05/02/18   Garlon Hatchet, PA-C  naproxen sodium (ALEVE) 220 MG tablet Take 220 mg by mouth as needed (chest pain).    [provider]  VALERIAN ROOT PO Take 2 capsules by mouth 2 (two) times daily.    [provider]  losartan (COZAAR) 25 MG tablet Take 25 mg by mouth daily. 01/16/18 03/26/20  [provider]    Physical Exam: Vitals:   04/02/20 0917 04/02/20 1421 04/02/20 1600  BP: 116/69 124/76 124/69  Pulse: (!) 106 (!) 102 95  Resp: 18 20 12   Temp: 99.4 F (37.4 C) 99.3 F (37.4 C)   TempSrc: Oral Oral   SpO2: 97% 91% 92%  Weight: 80.7 kg    Height: 5\' 4"  (1.626 m)     Constitutional: Resting supine in bed, NAD, calm, appears tired but comfortable Eyes: PERRL, lids and conjunctivae normal ENMT: Mucous membranes are dry. Posterior pharynx clear of any exudate or lesions.Normal dentition.  Neck: normal, supple, no masses. Respiratory: Faint inspiratory crackles mid left lung field otherwise clear to auscultation.  Normal respiratory effort. No accessory muscle use.  Cardiovascular: Regular rate and rhythm, no murmurs / rubs / gallops. No extremity edema. 2+ pedal pulses. Abdomen: no tenderness, no masses palpated. No hepatosplenomegaly. Bowel sounds positive.  Musculoskeletal: no clubbing / cyanosis. No joint deformity upper and lower extremities. Good ROM, no contractures. Normal muscle tone.  Skin: no rashes, lesions, ulcers. No  induration Neurologic: CN 2-12 grossly intact. Sensation intact, Strength 5/5 in all 4.  Psychiatric: Normal judgment and insight. Alert and oriented x 3. Normal mood.   Labs on Admission: I have personally reviewed following labs and imaging studies  CBC: Recent Labs  Lab 04/02/20 0931  WBC 6.1  HGB 14.6  HCT 41.7  MCV 89.3  PLT 238   Basic Metabolic Panel: Recent Labs  Lab 04/02/20 0931  NA 135  K 3.6  CL 102  CO2 23  GLUCOSE 116*  BUN <5*  CREATININE 0.62  CALCIUM 8.7*   GFR: Estimated Creatinine Clearance: 77.8 mL/min (by C-G formula based on SCr of 0.62 mg/dL). Liver Function Tests: Recent Labs  Lab 04/02/20 1544  AST 25  ALT 20  ALKPHOS 63  BILITOT 0.9  PROT 7.4  ALBUMIN 3.5   No results for input(s): LIPASE, AMYLASE in the last 168 hours. No results for input(s): AMMONIA in the last 168 hours. Coagulation Profile: No results for input(s): INR, PROTIME in the last 168 hours. Cardiac Enzymes: No results for input(s): CKTOTAL, CKMB, CKMBINDEX, TROPONINI in the last 168 hours. BNP (last 3 results) No results for input(s): PROBNP in the last  8760 hours. HbA1C: No results for input(s): HGBA1C in the last 72 hours. CBG: No results for input(s): GLUCAP in the last 168 hours. Lipid Profile: Recent Labs    04/02/20 1526  TRIG 102   Thyroid Function Tests: No results for input(s): TSH, T4TOTAL, FREET4, T3FREE, THYROIDAB in the last 72 hours. Anemia Panel: Recent Labs    04/02/20 1544  FERRITIN 392*   Urine analysis: No results found for: COLORURINE, APPEARANCEUR, LABSPEC, PHURINE, GLUCOSEU, HGBUR, BILIRUBINUR, KETONESUR, PROTEINUR, UROBILINOGEN, NITRITE, LEUKOCYTESUR  Radiological Exams on Admission: DG Chest Port 1 View  Result Date: 04/02/2020 CLINICAL DATA:  COVID positive, syncope. EXAM: PORTABLE CHEST 1 VIEW COMPARISON:  Prior chest radiographs 03/29/2020 and earlier. FINDINGS: Heart size within normal limits. New from the prior examination,  there is airspace disease within the left mid lung field. Redemonstrated ill-defined airspace disease within the left lung base. No evidence of pleural effusion or pneumothorax. No acute bony abnormality identified. IMPRESSION: Airspace disease within the left mid lung which is new as compared to the prior radiographs of 03/29/2020. Redemonstrated ill-defined airspace disease within the left lung base. Findings likely reflect sequela of atypical/viral pneumonia given the provided history of COVID positivity. Radiographic follow-up to resolution is recommended. Electronically Signed   By: Jackey Loge DO   On: 04/02/2020 15:52    EKG: Independently reviewed. Sinus tachycardia, LAD, rate is faster when compared to prior.  Assessment/Plan Principal Problem:   Acute hypoxemic respiratory failure due to COVID-19 [facility]) Active Problems:   Hypothyroidism  Summer Roberts is a 59 y.o. female with medical history significant for hypothyroidism and radiation exposure from proximity to [place] disaster (age 20, approximately 300 km away) who is admitted with acute hypoxemic respiratory failure due to COVID-19 pneumonitis.  Acute hypoxemic respiratory failure due to COVID-19 pneumonitis: SARS-CoV-2 NAA positive 03/29/2020.  Presenting with progressive symptoms, SPO2 low of 87% on room air, and interval developing airspace disease on chest x-ray.  She has been unvaccinated against COVID-19. -Start IV remdesivir per pharmacy -Start IV Decadron 6 mg daily -Start supplemental oxygen as needed -Flutter valve, incentive spirometer, albuterol, antitussives -Mobilize with PT -D-dimer 1.18, follow-up CTA chest PE study  Presyncope: Likely due to dehydration from acute illness and poor oral intake. -Give additional 1 L bolus followed by maintenance fluids overnight -Check orthostatic vitals -Fall precautions, PT -Monitor on telemetry overnight  Hypothyroidism: Continue Synthroid.  Check TSH.  DVT prophylaxis:  Lovenox Code Status: Full code, confirmed with patient Family Communication: Discussed with patient, she has discussed with family Disposition Plan: From home and likely discharge to home pending symptomatic improvement and ability to transition to outpatient therapy Consults called: None Admission status:  Status is: Inpatient  Remains inpatient appropriate because:IV treatments appropriate due to intensity of illness or inability to take PO and Inpatient level of care appropriate due to severity of illness  Dispo: The patient is from: Home              Anticipated d/c is to: Home              Anticipated d/c date is: 3 days pending symptomatic improvement and ability to transition to outpatient therapy              Patient currently is not medically stable to d/c.   Darreld Mclean MD [facility]  If 7PM-7AM, please contact night-coverage [email or web address]  04/02/2020, 9:34 PM

## 2020-04-02 NOTE — ED Provider Notes (Signed)
[facility] EMERGENCY DEPARTMENT Provider Note   CSN: [id number] Arrival date & time: 04/02/20  5053     History Chief Complaint  Patient presents with  . Covid/Syncope    Summer Roberts is a 59 y.o. female with a past medical history of hypothyroid who presents today for evaluation of generalized weakness.  She was diagnosed with Covid 4 days ago however reports that she started feeling poorly about 14 days ago.  She states that she has had poor appetite for the past 14 days and feels like she needs something to "kick start my body or I am going to die."  She has been seen at urgent care twice, states that she is not worried about her lungs because she has been taking the previously prescribed azithromycin and steroids.  She reports that she has a pulse oximeter and checks her oxygen at home, notes that it has been dipping into the 90s at home.  She states mostly she feels very fatigued and weak.   She is unvaccinated.   She reports that she had a syncopal episode this morning causing her to strike her head.  HPI     Past Medical History:  Diagnosis Date  . Thyroid disease     Patient Active Problem List   Diagnosis Date Noted  . Acute hypoxemic respiratory failure due to COVID-19 (HCC) 04/02/2020  . Hypothyroidism 05/21/2007    Past Surgical History:  Procedure Laterality Date  . LAPAROSCOPIC CHOLECYSTECTOMY  01/08/2019     OB History   No obstetric history on file.     Family History  Problem Relation Age of Onset  . Cancer Mother   . Hypertension Maternal Grandmother   . Hypertension Paternal Grandfather     Social History   Tobacco Use  . Smoking status: Never Smoker  . Smokeless tobacco: Never Used  Substance Use Topics  . Alcohol use: No  . Drug use: Never    Home Medications Prior to Admission medications   Medication Sig Start Date End Date Taking? Authorizing Provider  acetaminophen (TYLENOL) 500 MG tablet Take 1,000 mg by  mouth every 6 (six) hours as needed for mild pain.    [provider]  aspirin EC 81 MG tablet Take 81 mg by mouth daily as needed (for pain or headaches).  03/18/09   [provider]  aspirin-acetaminophen-caffeine (EXCEDRIN MIGRAINE) [phone] MG tablet Take 2 tablets by mouth as needed (chest pain).    [provider]  azithromycin (ZITHROMAX) 250 MG tablet Take 1 tablet (250 mg total) by mouth daily. Take first 2 tablets together, then 1 every day until finished. 03/29/20   Avegno, Zachery Dakins, FNP  benzonatate (TESSALON) 100 MG capsule Take 1 capsule (100 mg total) by mouth every 8 (eight) hours. 03/29/20   Avegno, Zachery Dakins, FNP  famotidine (PEPCID) 40 MG tablet Take 1 tablet (40 mg total) by mouth 2 (two) times daily. 05/10/17 05/10/18  Marquette Saa, MD  ibuprofen (ADVIL,MOTRIN) 200 MG tablet Take 600 mg by mouth every 6 (six) hours as needed for headache (or pain).    [provider]  levothyroxine (SYNTHROID, LEVOTHROID) 125 MCG tablet Take 125 mcg by mouth daily before breakfast.  10/23/15 04/09/18  [provider]  LORazepam (ATIVAN) 1 MG tablet Take 1 tablet (1 mg total) by mouth 3 (three) times daily as needed for anxiety. 05/02/18   Garlon Hatchet, PA-C  naproxen sodium (ALEVE) 220 MG tablet Take 220 mg by  mouth as needed (chest pain).    [provider]  VALERIAN ROOT PO Take 2 capsules by mouth 2 (two) times daily.    [provider]  losartan (COZAAR) 25 MG tablet Take 25 mg by mouth daily. 01/16/18 03/26/20  [provider]    Allergies    Patient has no known allergies.  Review of Systems   Review of Systems  Constitutional: Positive for chills, fatigue and fever.  HENT: Negative for congestion.   Eyes: Negative for visual disturbance.  Respiratory: Positive for cough, chest tightness and shortness of breath.   Cardiovascular: Negative for palpitations and leg swelling.  Gastrointestinal: Positive  for nausea. Negative for abdominal pain, diarrhea and vomiting.  Genitourinary: Negative for dysuria.  Musculoskeletal: Positive for arthralgias and myalgias.  Skin: Negative for color change and rash.  Neurological: Positive for weakness and headaches.  All other systems reviewed and are negative.   Physical Exam Updated Vital Signs BP 124/69   Pulse 95   Temp 99.3 F (37.4 C) (Oral)   Resp 12   Ht 5\' 4"  (1.626 m)   Wt 80.7 kg   LMP  (LMP Unknown)   SpO2 92%   BMI 30.55 kg/m   Physical Exam Vitals and nursing note reviewed.  Constitutional:      Appearance: She is well-developed. She is ill-appearing. She is not diaphoretic.     Comments: Overall patient is globally week and appears ill/unwell but is not in extremis.   HENT:     Head: Normocephalic and atraumatic.  Eyes:     General: No scleral icterus.       Right eye: No discharge.        Left eye: No discharge.     Conjunctiva/sclera: Conjunctivae normal.  Cardiovascular:     Rate and Rhythm: Normal rate and regular rhythm.     Pulses: Normal pulses.  Pulmonary:     Effort: Pulmonary effort is normal. No respiratory distress.     Breath sounds: No stridor.     Comments: Patient gets very short of breath with any movement in bed. Abdominal:     General: There is no distension.     Tenderness: There is no abdominal tenderness. There is no guarding.  Musculoskeletal:        General: No deformity.     Cervical back: Normal range of motion.     Right lower leg: No edema.     Left lower leg: No edema.  Skin:    General: Skin is warm and dry.  Neurological:     General: No focal deficit present.     Mental Status: She is alert and oriented to person, place, and time.     Motor: No abnormal muscle tone.  Psychiatric:        Mood and Affect: Mood normal.        Behavior: Behavior normal.     ED Results / Procedures / Treatments   Labs (all labs ordered are listed, but only abnormal results are  displayed) Labs Reviewed  BASIC METABOLIC PANEL - Abnormal; Notable for the following components:      Result Value   Glucose, Bld 116 (*)    BUN <5 (*)    Calcium 8.7 (*)    All other components within normal limits  D-DIMER, QUANTITATIVE (NOT AT [facility]) - Abnormal; Notable for the following components:   D-Dimer, Quant 1.18 (*)    All other components within normal limits  LACTATE DEHYDROGENASE -  Abnormal; Notable for the following components:   LDH 332 (*)    All other components within normal limits  FERRITIN - Abnormal; Notable for the following components:   Ferritin 392 (*)    All other components within normal limits  FIBRINOGEN - Abnormal; Notable for the following components:   Fibrinogen 601 (*)    All other components within normal limits  C-REACTIVE PROTEIN - Abnormal; Notable for the following components:   CRP 5.7 (*)    All other components within normal limits  CULTURE, BLOOD (ROUTINE X 2)  CULTURE, BLOOD (ROUTINE X 2)  CBC  LACTIC ACID, PLASMA  PROCALCITONIN  TRIGLYCERIDES  HEPATIC FUNCTION PANEL  URINALYSIS, ROUTINE W REFLEX MICROSCOPIC  TSH  HIV ANTIBODY (ROUTINE TESTING W REFLEX)  CBC WITH DIFFERENTIAL/PLATELET  COMPREHENSIVE METABOLIC PANEL  C-REACTIVE PROTEIN  D-DIMER, QUANTITATIVE (NOT AT [facility])  FERRITIN  MAGNESIUM  PHOSPHORUS  I-STAT BETA HCG BLOOD, ED ([facility], [facility], [facility] ONLY)  ABO/RH    EKG EKG Interpretation  Date/Time:  Thursday April 02 2020 09:16:29 EDT Ventricular Rate:  108 PR Interval:  134 QRS Duration: 78 QT Interval:  344 QTC Calculation: 460 R Axis:   -43 Text Interpretation: Sinus tachycardia Left axis deviation Nonspecific ST abnormality Abnormal ECG Confirmed by Tilden Fossaees, Shameika Speelman [phone]([id number]) on 04/02/2020 2:49:28 PM   Radiology DG Chest Port 1 View  Result Date: 04/02/2020 CLINICAL DATA:  COVID positive, syncope. EXAM: PORTABLE CHEST 1 VIEW COMPARISON:  Prior chest radiographs 03/29/2020 and earlier. FINDINGS: Heart size within  normal limits. New from the prior examination, there is airspace disease within the left mid lung field. Redemonstrated ill-defined airspace disease within the left lung base. No evidence of pleural effusion or pneumothorax. No acute bony abnormality identified. IMPRESSION: Airspace disease within the left mid lung which is new as compared to the prior radiographs of 03/29/2020. Redemonstrated ill-defined airspace disease within the left lung base. Findings likely reflect sequela of atypical/viral pneumonia given the provided history of COVID positivity. Radiographic follow-up to resolution is recommended. Electronically Signed   By: Jackey LogeKyle  Golden DO   On: 04/02/2020 15:52    Procedures Procedures (including critical care time)  Medications Ordered in ED Medications  lactated ringers bolus 500 mL (0 mLs Intravenous Stopped 04/02/20 1926)    ED Course  I have reviewed the triage vital signs and the nursing notes.  Pertinent labs & imaging results that were available during my care of the patient were reviewed by me and considered in my medical decision making (see chart for details).    MDM Rules/Calculators/A&P                         Patient is a 59 year old woman who presents today for evaluation of COVID-19 with associated syncope.  On my exam she appears globally weak, without focal neuro deficit, and gets very short of breath with any exertion.  She is borderline hypoxic running between 91 to 93% on room air and drops down to 90 with moving around in the bed.  Unable to attempt to ambulate due to her suspected orthostasis given her poor p.o. intake and her reported syncopal event.  As she struck her head CT head and neck are ordered.  Given her being Covid positive, suspected to be on day 14, along with her syncopal event CT PE study is ordered.    CBC and BMP are unremarkable.  Lactic acid is not significantly elevated.  Hepatic function panel is unremarkable.  Hospitalist is consulted  given patient's overall clinical picture.  I spoke with Dr. Allena Katz who will see the patient for admission.  Shawntia Mangal was evaluated in Emergency Department on 04/02/2020 for the symptoms described in the history of present illness. She was evaluated in the context of the global COVID-19 pandemic, which necessitated consideration that the patient might be at risk for infection with the SARS-CoV-2 virus that causes COVID-19. Institutional protocols and algorithms that pertain to the evaluation of patients at risk for COVID-19 are in a state of rapid change based on information released by regulatory bodies including the CDC and federal and state organizations. These policies and algorithms were followed during the patient's care in the ED.  Note: Portions of this report may have been transcribed using voice recognition software. Every effort was made to ensure accuracy; however, inadvertent computerized transcription errors may be present   Final Clinical Impression(s) / ED Diagnoses Final diagnoses:  COVID-19    Rx / DC Orders ED Discharge Orders    None       Norman Clay 04/02/20 2135    Tilden Fossa, MD 04/03/20 [id number]

## 2020-04-02 NOTE — ED Triage Notes (Signed)
Patient arrived by [facility] for weakness and syncopal episode this am. Diagnosed with Covid 4 days ago. Patient alert and orirnted, reports decreased appetite and intake. Mild headache

## 2020-04-03 DIAGNOSIS — U071 COVID-19: Principal | ICD-10-CM

## 2020-04-03 DIAGNOSIS — J9601 Acute respiratory failure with hypoxia: Secondary | ICD-10-CM

## 2020-04-03 LAB — CBC WITH DIFFERENTIAL/PLATELET
Abs Immature Granulocytes: 0.04 10*3/uL (ref 0.00–0.07)
Basophils Absolute: 0 10*3/uL (ref 0.0–0.1)
Basophils Relative: 0 %
Eosinophils Absolute: 0 10*3/uL (ref 0.0–0.5)
Eosinophils Relative: 0 %
HCT: 37 % (ref 36.0–46.0)
Hemoglobin: 12.4 g/dL (ref 12.0–15.0)
Immature Granulocytes: 1 %
Lymphocytes Relative: 17 %
Lymphs Abs: 0.8 10*3/uL (ref 0.7–4.0)
MCH: 30.2 pg (ref 26.0–34.0)
MCHC: 33.5 g/dL (ref 30.0–36.0)
MCV: 90.2 fL (ref 80.0–100.0)
Monocytes Absolute: 0.2 10*3/uL (ref 0.1–1.0)
Monocytes Relative: 4 %
Neutro Abs: 3.5 10*3/uL (ref 1.7–7.7)
Neutrophils Relative %: 78 %
Platelets: 217 10*3/uL (ref 150–400)
RBC: 4.1 MIL/uL (ref 3.87–5.11)
RDW: 12.8 % (ref 11.5–15.5)
WBC: 4.6 10*3/uL (ref 4.0–10.5)
nRBC: 0 % (ref 0.0–0.2)

## 2020-04-03 LAB — COMPREHENSIVE METABOLIC PANEL
ALT: 17 U/L (ref 0–44)
AST: 20 U/L (ref 15–41)
Albumin: 2.8 g/dL — ABNORMAL LOW (ref 3.5–5.0)
Alkaline Phosphatase: 59 U/L (ref 38–126)
Anion gap: 9 (ref 5–15)
BUN: <5 mg/dL — ABNORMAL LOW (ref 6–20)
CO2: 21 mmol/L — ABNORMAL LOW (ref 22–32)
Calcium: 8.2 mg/dL — ABNORMAL LOW (ref 8.9–10.3)
Chloride: 104 mmol/L (ref 98–111)
Creatinine, Ser: 0.57 mg/dL (ref 0.44–1.00)
GFR calc Af Amer: >60 mL/min (ref >60)
GFR calc non Af Amer: >60 mL/min (ref >60)
Glucose, Bld: 199 mg/dL — ABNORMAL HIGH (ref 70–99)
Potassium: 3.8 mmol/L (ref 3.5–5.1)
Sodium: 134 mmol/L — ABNORMAL LOW (ref 135–145)
Total Bilirubin: 0.6 mg/dL (ref 0.3–1.2)
Total Protein: 6.2 g/dL — ABNORMAL LOW (ref 6.5–8.1)

## 2020-04-03 LAB — URINALYSIS, ROUTINE W REFLEX MICROSCOPIC
Bilirubin Urine: NEGATIVE
Glucose, UA: NEGATIVE mg/dL
Hgb urine dipstick: NEGATIVE
Ketones, ur: NEGATIVE mg/dL
Leukocytes,Ua: NEGATIVE
Nitrite: NEGATIVE
Protein, ur: NEGATIVE mg/dL
Specific Gravity, Urine: 1.009 (ref 1.005–1.030)
pH: 7 (ref 5.0–8.0)

## 2020-04-03 LAB — HIV ANTIBODY (ROUTINE TESTING W REFLEX): HIV Screen 4th Generation wRfx: NONREACTIVE

## 2020-04-03 LAB — PHOSPHORUS: Phosphorus: 2.6 mg/dL (ref 2.5–4.6)

## 2020-04-03 LAB — D-DIMER, QUANTITATIVE: D-Dimer, Quant: 0.88 ug/mL-FEU — ABNORMAL HIGH (ref 0.00–0.50)

## 2020-04-03 LAB — MAGNESIUM: Magnesium: 2 mg/dL (ref 1.7–2.4)

## 2020-04-03 LAB — FERRITIN: Ferritin: 289 ng/mL (ref 11–307)

## 2020-04-03 LAB — C-REACTIVE PROTEIN: CRP: 8 mg/dL — ABNORMAL HIGH (ref <1.0)

## 2020-04-03 NOTE — Progress Notes (Signed)
PROGRESS NOTE    Summer Roberts  ZOX:[id number] DOB: 12-Jan-1961 DOA: 04/02/2020 PCP: Kathyrn Sheriff, MD    Brief Narrative:  59 year old female with history of hypothyroidism presents to the ER with progressive generalized weakness, chills and cough.  She has symptoms of upper respiratory tract infection started almost more than 2 weeks now. Seen at emergency room on 8/12 and treated with bronchitis with a course of prednisone with no improvement. Repeat presentation to urgent care on 8/15, course of azithromycin and reported positive Covid test.  Continue to feel not well, felt lightheaded and fell to the ground so came to the ER. In the emergency room, hemodynamically stable.  On ambulation, saturation 87% on room air.  Chest x-ray showed left midlung airspace disease.  CTA of the chest showed bilateral pneumonia extensive groundglass opacities but no PE.   Assessment & Plan:   Principal Problem:   Acute hypoxemic respiratory failure due to COVID-19 [employer]) Active Problems:   Hypothyroidism  Acute hypoxemic respiratory failure due to COVID-19 pneumonia: Continue to monitor due to significant symptoms  chest physiotherapy, incentive spirometry, deep breathing exercises, sputum induction, mucolytic's and bronchodilators. Supplemental oxygen to keep saturations more than 90%. Covid directed therapy with , steroids, dexamethasone remdesivir, day 2/5 antibiotics, not indicated Due to severity of symptoms, patient will need daily inflammatory markers, chest x-rays, liver function test to monitor and direct COVID-19 therapies.  COVID-19 Labs  Recent Labs    04/02/20 1544 04/03/20 0407  DDIMER 1.18* 0.88*  FERRITIN 392* 289  LDH 332*  --   CRP 5.7* 8.0*    Lab Results  Component Value Date   SARSCOV2NAA Detected (A) 03/29/2020   SpO2: 92 % O2 Flow Rate (L/min): 1 L/min  Presyncope: Due to dehydration.  Improving.  Hypothyroidism: On Synthroid.  Vaccination status: Patient  does not take COVID-19 vaccine.  She thinks he is healthy and does not require it.  We discussed about benefits and patient will consider in the future.     DVT prophylaxis: enoxaparin (LOVENOX) injection 40 mg Start: 04/02/20 2215   Code Status: Full code Family Communication: None.  Patient talking to the family. Disposition Plan: Status is: Inpatient  Remains inpatient appropriate because:IV treatments appropriate due to intensity of illness or inability to take PO and Inpatient level of care appropriate due to severity of illness   Dispo: The patient is from: Home              Anticipated d/c is to: Home              Anticipated d/c date is: 3 days              Patient currently is not medically stable to d/c.         Consultants:   None  Procedures:   None  Antimicrobials:  Antibiotics Given (last 72 hours)    Date/Time Action Medication Dose Rate   04/02/20 2344 New Bag/Given   remdesivir 200 mg in sodium chloride 0.9% 250 mL IVPB 200 mg 580 mL/hr   04/03/20 0932 New Bag/Given   remdesivir 100 mg in sodium chloride 0.9 % 100 mL IVPB 100 mg 200 mL/hr         Subjective: Patient seen and examined.  She is a still in the emergency room waiting for inpatient bed assignment.  Patient stated that she was very dry dehydrated and falling but now she feels better.  She has some cough and difficult to take deep breath.  She is on 1 to 2 L of oxygen.  Remains afebrile.  Objective: Vitals:   04/03/20 1200 04/03/20 1300 04/03/20 1330 04/03/20 1400  BP: 131/75 128/72 127/83 126/68  Pulse: 94 85 92 96  Resp: 15 15 19  (!) 21  Temp:      TempSrc:      SpO2: 94% 95% 93% 92%  Weight:      Height:       No intake or output data in the 24 hours ending 04/03/20 1528 Filed Weights   04/02/20 0917  Weight: 80.7 kg    Examination:  General exam: Appears calm and comfortable Slightly anxious.  Not in distress.  On 1 L oxygen. Respiratory system: Clear to  auscultation. Respiratory effort normal.  No added sounds. Cardiovascular system: S1 & S2 heard, RRR. No JVD, murmurs, rubs, gallops or clicks. No pedal edema. Gastrointestinal system: Abdomen is nondistended, soft and nontender. No organomegaly or masses felt. Normal bowel sounds heard. Central nervous system: Alert and oriented. No focal neurological deficits. Extremities: Symmetric 5 x 5 power. Skin: No rashes, lesions or ulcers Psychiatry: Judgement and insight appear normal. Mood & affect appropriate.     Data Reviewed: I have personally reviewed following labs and imaging studies  CBC: Recent Labs  Lab 04/02/20 0931 04/03/20 0407  WBC 6.1 4.6  NEUTROABS  --  3.5  HGB 14.6 12.4  HCT 41.7 37.0  MCV 89.3 90.2  PLT 238 217   Basic Metabolic Panel: Recent Labs  Lab 04/02/20 0931 04/03/20 0407  NA 135 134*  K 3.6 3.8  CL 102 104  CO2 23 21*  GLUCOSE 116* 199*  BUN <5* <5*  CREATININE 0.62 0.57  CALCIUM 8.7* 8.2*  MG  --  2.0  PHOS  --  2.6   GFR: Estimated Creatinine Clearance: 77.8 mL/min (by C-G formula based on SCr of 0.57 mg/dL). Liver Function Tests: Recent Labs  Lab 04/02/20 1544 04/03/20 0407  AST 25 20  ALT 20 17  ALKPHOS 63 59  BILITOT 0.9 0.6  PROT 7.4 6.2*  ALBUMIN 3.5 2.8*   No results for input(s): LIPASE, AMYLASE in the last 168 hours. No results for input(s): AMMONIA in the last 168 hours. Coagulation Profile: No results for input(s): INR, PROTIME in the last 168 hours. Cardiac Enzymes: No results for input(s): CKTOTAL, CKMB, CKMBINDEX, TROPONINI in the last 168 hours. BNP (last 3 results) No results for input(s): PROBNP in the last 8760 hours. HbA1C: No results for input(s): HGBA1C in the last 72 hours. CBG: No results for input(s): GLUCAP in the last 168 hours. Lipid Profile: Recent Labs    04/02/20 1526  TRIG 102   Thyroid Function Tests: Recent Labs    04/02/20 2105  TSH 2.345   Anemia Panel: Recent Labs     04/02/20 1544 04/03/20 0407  FERRITIN 392* 289   Sepsis Labs: Recent Labs  Lab 04/02/20 1526 04/02/20 1544  PROCALCITON  --  <0.10  LATICACIDVEN 1.5  --     Recent Results (from the past 240 hour(s))  Novel Coronavirus, NAA ([facility])     Status: Abnormal   Collection Time: 03/29/20  8:42 AM   Specimen: Nasopharyngeal(NP) swabs in vial transport medium   Nasopharynge  Patient  Result Value Ref Range Status   SARS-CoV-2, NAA Detected (A) Not Detected Final    Comment: Patients who have a positive COVID-19 test result may now have treatment options. Treatment options are available for patients with mild to moderate  symptoms and for hospitalized patients. Visit our website at [email or web address] for resources and information. This nucleic acid amplification test was developed and its performance characteristics determined by [facility]. Nucleic acid amplification tests include RT-PCR and TMA. This test has not been FDA cleared or approved. This test has been authorized by FDA under an Emergency Use Authorization (EUA). This test is only authorized for the duration of time the declaration that circumstances exist justifying the authorization of the emergency use of in vitro diagnostic tests for detection of SARS-CoV-2 virus and/or diagnosis of COVID-19 infection under section 564(b)(1) of the Act, 21 U.S.C. 621HYQ-6(V360bbb-3(b) (1), unless the authorization is terminated or revoked sooner. When diagnostic testing is negativ e, the possibility of a false negative result should be considered in the context of a patient's recent exposures and the presence of clinical signs and symptoms consistent with COVID-19. An individual without symptoms of COVID-19 and who is not shedding SARS-CoV-2 virus would expect to have a negative (not detected) result in this assay.   SARS-COV-2, NAA 2 DAY TAT     Status: None   Collection Time: 03/29/20  8:42 AM   Nasopharynge  Patient   Result Value Ref Range Status   SARS-CoV-2, NAA 2 DAY TAT Performed  Final  Blood Culture (routine x 2)     Status: None (Preliminary result)   Collection Time: 04/02/20  3:49 PM   Specimen: BLOOD  Result Value Ref Range Status   Specimen Description BLOOD LEFT ANTECUBITAL  Final   Special Requests   Final    BOTTLES DRAWN AEROBIC AND ANAEROBIC Blood Culture adequate volume   Culture   Final    NO GROWTH < 12 HOURS Performed at [facility], [address] [place], [place] [address]    Report Status PENDING  Incomplete         Radiology Studies: CT Angio Chest PE W/Cm &/Or Wo Cm  Result Date: 04/02/2020 CLINICAL DATA:  Weakness and syncope. EXAM: CT ANGIOGRAPHY CHEST WITH CONTRAST TECHNIQUE: Multidetector CT imaging of the chest was performed using the standard protocol during bolus administration of intravenous contrast. Multiplanar CT image reconstructions and MIPs were obtained to evaluate the vascular anatomy. CONTRAST:  100mL OMNIPAQUE IOHEXOL 300 MG/ML  SOLN COMPARISON:  April 09, 2018 FINDINGS: Cardiovascular: Satisfactory opacification of the pulmonary arteries to the segmental level. No evidence of pulmonary embolism. Normal heart size. No pericardial effusion. Mediastinum/Nodes: There is mild bilateral hilar lymphadenopathy. Thyroid gland, trachea, and esophagus demonstrate no significant findings. Lungs/Pleura: Moderate to marked severity infiltrates are seen within the posterior aspects of the bilateral lower lobes and posterolateral aspect of the left upper lobe. Small multifocal infiltrates are seen within the right upper lobe and right middle lobe. A 7 mm noncalcified right upper lobe lung nodule is present (axial CT image 34, CT series number 7). This represents a new finding when compared to the prior exam. Upper Abdomen: No acute abnormality. Musculoskeletal: No chest wall abnormality. No acute or significant osseous findings. Review of the MIP images confirms the  above findings. IMPRESSION: 1. No evidence of pulmonary embolus. 2. Moderate to marked severity infiltrates within the posterior aspects of the bilateral lower lobes and posterolateral aspect of the left upper lobe, with small multifocal infiltrates within the right upper lobe and right middle lobe. Findings are consistent with multifocal pneumonia. 3. 7 mm noncalcified right upper lobe lung nodule. This represents a new finding when compared to the prior exam. Recommend follow-up chest CT in  3 months. Electronically Signed   By: Aram Candela M.D.   On: 04/02/2020 22:23   DG Chest Port 1 View  Result Date: 04/02/2020 CLINICAL DATA:  COVID positive, syncope. EXAM: PORTABLE CHEST 1 VIEW COMPARISON:  Prior chest radiographs 03/29/2020 and earlier. FINDINGS: Heart size within normal limits. New from the prior examination, there is airspace disease within the left mid lung field. Redemonstrated ill-defined airspace disease within the left lung base. No evidence of pleural effusion or pneumothorax. No acute bony abnormality identified. IMPRESSION: Airspace disease within the left mid lung which is new as compared to the prior radiographs of 03/29/2020. Redemonstrated ill-defined airspace disease within the left lung base. Findings likely reflect sequela of atypical/viral pneumonia given the provided history of COVID positivity. Radiographic follow-up to resolution is recommended. Electronically Signed   By: Jackey Loge DO   On: 04/02/2020 15:52        Scheduled Meds: . albuterol  2 puff Inhalation Q6H  . vitamin C  500 mg Oral Daily  . dexamethasone (DECADRON) injection  6 mg Intravenous QHS  . enoxaparin (LOVENOX) injection  40 mg Subcutaneous QHS  . levothyroxine  125 mcg Oral QAC breakfast  . sodium chloride  1-2 spray Each Nare Q2H  . sodium chloride flush  3 mL Intravenous Q12H  . zinc sulfate  220 mg Oral Daily   Continuous Infusions: . remdesivir 100 mg in NS 100 mL Stopped (04/03/20  1224)     LOS: 1 day    Time spent: 30 minutes    Dorcas Carrow, MD [facility] Pager [phone]

## 2020-04-04 ENCOUNTER — Other Ambulatory Visit: Payer: Self-pay

## 2020-04-04 ENCOUNTER — Encounter (HOSPITAL_COMMUNITY): Payer: Self-pay | Admitting: Internal Medicine

## 2020-04-04 LAB — CBC WITH DIFFERENTIAL/PLATELET
Abs Immature Granulocytes: 0.1 10*3/uL — ABNORMAL HIGH (ref 0.00–0.07)
Basophils Absolute: 0 10*3/uL (ref 0.0–0.1)
Basophils Relative: 0 %
Eosinophils Absolute: 0 10*3/uL (ref 0.0–0.5)
Eosinophils Relative: 0 %
HCT: 37.7 % (ref 36.0–46.0)
Hemoglobin: 12.6 g/dL (ref 12.0–15.0)
Immature Granulocytes: 2 %
Lymphocytes Relative: 17 %
Lymphs Abs: 1.1 10*3/uL (ref 0.7–4.0)
MCH: 29.9 pg (ref 26.0–34.0)
MCHC: 33.4 g/dL (ref 30.0–36.0)
MCV: 89.3 fL (ref 80.0–100.0)
Monocytes Absolute: 0.4 10*3/uL (ref 0.1–1.0)
Monocytes Relative: 6 %
Neutro Abs: 4.6 10*3/uL (ref 1.7–7.7)
Neutrophils Relative %: 75 %
Platelets: 268 10*3/uL (ref 150–400)
RBC: 4.22 MIL/uL (ref 3.87–5.11)
RDW: 12.9 % (ref 11.5–15.5)
WBC: 6.1 10*3/uL (ref 4.0–10.5)
nRBC: 0 % (ref 0.0–0.2)

## 2020-04-04 LAB — D-DIMER, QUANTITATIVE: D-Dimer, Quant: 0.77 ug/mL-FEU — ABNORMAL HIGH (ref 0.00–0.50)

## 2020-04-04 LAB — COMPREHENSIVE METABOLIC PANEL
ALT: 20 U/L (ref 0–44)
AST: 18 U/L (ref 15–41)
Albumin: 3 g/dL — ABNORMAL LOW (ref 3.5–5.0)
Alkaline Phosphatase: 57 U/L (ref 38–126)
Anion gap: 11 (ref 5–15)
BUN: 13 mg/dL (ref 6–20)
CO2: 22 mmol/L (ref 22–32)
Calcium: 9.1 mg/dL (ref 8.9–10.3)
Chloride: 105 mmol/L (ref 98–111)
Creatinine, Ser: 0.59 mg/dL (ref 0.44–1.00)
GFR calc Af Amer: >60 mL/min (ref >60)
GFR calc non Af Amer: >60 mL/min (ref >60)
Glucose, Bld: 151 mg/dL — ABNORMAL HIGH (ref 70–99)
Potassium: 4.3 mmol/L (ref 3.5–5.1)
Sodium: 138 mmol/L (ref 135–145)
Total Bilirubin: 0.6 mg/dL (ref 0.3–1.2)
Total Protein: 6.7 g/dL (ref 6.5–8.1)

## 2020-04-04 LAB — MAGNESIUM: Magnesium: 2.3 mg/dL (ref 1.7–2.4)

## 2020-04-04 LAB — C-REACTIVE PROTEIN: CRP: 6.6 mg/dL — ABNORMAL HIGH (ref <1.0)

## 2020-04-04 LAB — FERRITIN: Ferritin: 323 ng/mL — ABNORMAL HIGH (ref 11–307)

## 2020-04-04 LAB — PHOSPHORUS: Phosphorus: 3.3 mg/dL (ref 2.5–4.6)

## 2020-04-04 MED ORDER — ALUM & MAG HYDROXIDE-SIMETH 200-200-20 MG/5ML PO SUSP
30.0000 mL | ORAL | Status: DC | PRN
  Administered 2020-04-04: 30 mL via ORAL
  Filled 2020-04-04: qty 30

## 2020-04-04 NOTE — ED Notes (Signed)
Attempted report x1. 

## 2020-04-04 NOTE — ED Notes (Signed)
Provided pt with fresh water

## 2020-04-04 NOTE — Evaluation (Signed)
Physical Therapy Evaluation Patient Details Name: Summer Roberts MRN: [id number] DOB: 27-Jun-1961 Today's Date: 04/04/2020   History of Present Illness  59 year old female with history of hypothyroidism presents to the ER with progressive generalized weakness, chills and cough. COVID+    Clinical Impression  Pt admitted with above diagnosis. On eval, she demonstrated independence with bed mobility and transfers. Supervision provided for ambulation 75' x 2 without AD. Gait distances limited to in room only due to COVID+ in ED. SpO2 100% on 2L O2 on arrival. Ambulated on RA with desat to 87%. SpO2 90% on RA at rest. Pt replaced 2L O2 at end of session with SpO2 94%. Pt tolerated Rx well. No SOB or dyspnea noted.  Pt will benefit from skilled PT to increase their independence and safety with mobility to allow discharge to the venue listed below.  PT to follow acutely. No follow up PT indicated.     Follow Up Recommendations No PT follow up    Equipment Recommendations  None recommended by PT    Recommendations for Other Services       Precautions / Restrictions Precautions Precautions: Other (comment) Precaution Comments: watch sats      Mobility  Bed Mobility Overal bed mobility: Independent                Transfers Overall transfer level: Independent Equipment used: None                Ambulation/Gait Ambulation/Gait assistance: Supervision Gait Distance (Feet): 75 Feet (x 2) Assistive device: None Gait Pattern/deviations: Step-through pattern Gait velocity: WFL Gait velocity interpretation: >2.62 ft/sec, indicative of community ambulatory General Gait Details: Gait limited to in room due to COVID+ in ED. SpO2 100% on 2L O2 at rest. Ambulated on RA with desat to 87%. SpO2 90% at rest on RA.  Stairs            Wheelchair Mobility    Modified Rankin (Stroke Patients Only)       Balance Overall balance assessment: No apparent balance deficits (not  formally assessed)                                           Pertinent Vitals/Pain Pain Assessment: No/denies pain    Home Living Family/patient expects to be discharged to:: Private residence Living Arrangements: Spouse/significant other Available Help at Discharge: Family;Available 24 hours/day Type of Home: House Home Access: Level entry     Home Layout: One level Home Equipment: None      Prior Function Level of Independence: Independent               Hand Dominance        Extremity/Trunk Assessment   Upper Extremity Assessment Upper Extremity Assessment: Overall WFL for tasks assessed    Lower Extremity Assessment Lower Extremity Assessment: Overall WFL for tasks assessed    Cervical / Trunk Assessment Cervical / Trunk Assessment: Normal  Communication   Communication: No difficulties  Cognition Arousal/Alertness: Awake/alert Behavior During Therapy: WFL for tasks assessed/performed Overall Cognitive Status: Within Functional Limits for tasks assessed                                        General Comments General comments (skin integrity, edema, etc.): Pt on 2L O2 on  arrival, SpO2 100%. Amb on RA with desat to 87%. SpO2 90% at rest on rest. Pt returned to 2L O2 at end of session, SpO2 94%.    Exercises     Assessment/Plan    PT Assessment Patient needs continued PT services  PT Problem List Decreased mobility;Cardiopulmonary status limiting activity;Decreased activity tolerance       PT Treatment Interventions Therapeutic exercise;Functional mobility training;Therapeutic activities    PT Goals (Current goals can be found in the Care Plan section)  Acute Rehab PT Goals Patient Stated Goal: home PT Goal Formulation: With patient Time For Goal Achievement: 04/11/20 Potential to Achieve Goals: Good    Frequency Min 3X/week   Barriers to discharge        Co-evaluation               AM-PAC PT "6  Clicks" Mobility  Outcome Measure Help needed turning from your back to your side while in a flat bed without using bedrails?: None Help needed moving from lying on your back to sitting on the side of a flat bed without using bedrails?: None Help needed moving to and from a bed to a chair (including a wheelchair)?: None Help needed standing up from a chair using your arms (e.g., wheelchair or bedside chair)?: None Help needed to walk in hospital room?: None Help needed climbing 3-5 steps with a railing? : A Little 6 Click Score: 23    End of Session Equipment Utilized During Treatment: Oxygen Activity Tolerance: Patient tolerated treatment well Patient left: in bed;with call bell/phone within reach Nurse Communication: Mobility status PT Visit Diagnosis: Difficulty in walking, not elsewhere classified (R26.2)    Time: 0454-0981 PT Time Calculation (min) (ACUTE ONLY): 19 min   Charges:   PT Evaluation $PT Eval Low Complexity: 1 Low          Aida Raider, PT  Office # [phone] Pager [phone]   Ilda Foil 04/04/2020, 2:57 PM

## 2020-04-04 NOTE — ED Notes (Signed)
Lunch tray provided to pt.

## 2020-04-04 NOTE — ED Notes (Signed)
Attempted report x 2 

## 2020-04-04 NOTE — ED Notes (Signed)
Pt is NSR on monitor 

## 2020-04-04 NOTE — ED Notes (Signed)
Incentive spirometer and flutter valve given to patient with education. Patient verbalized understanding.   

## 2020-04-04 NOTE — Progress Notes (Signed)
PROGRESS NOTE    Summer Roberts  GSU:[id number] DOB: [birth year] DOA: 04/02/2020 PCP: Kathyrn Sheriff, MD    Brief Narrative:  59 year old female with history of hypothyroidism presents to the ER with progressive generalized weakness, chills and cough.  She has symptoms of upper respiratory tract infection started almost more than 2 weeks now. Seen at emergency room on 8/12 and treated with bronchitis with a course of prednisone with no improvement. Repeat presentation to urgent care on 8/15, course of azithromycin and reported positive Covid test.  Continue to feel not well, felt lightheaded and fell to the ground so came to the ER. In the emergency room, hemodynamically stable.  On ambulation, saturation 87% on room air.  Chest x-ray showed left midlung airspace disease.  CTA of the chest showed bilateral pneumonia extensive groundglass opacities but no PE.   Assessment & Plan:   Principal Problem:   Acute hypoxemic respiratory failure due to COVID-19 [facility]) Active Problems:   Hypothyroidism  Acute hypoxemic respiratory failure due to COVID-19 pneumonia: Continue to monitor due to significant symptoms  chest physiotherapy, incentive spirometry, deep breathing exercises, sputum induction, mucolytic's and bronchodilators. Supplemental oxygen to keep saturations more than 90%. Covid directed therapy with , steroids, dexamethasone remdesivir, day 3/5 antibiotics, not indicated Due to severity of symptoms, patient will need daily inflammatory markers, chest x-rays, liver function test to monitor and direct COVID-19 therapies.  COVID-19 Labs  Recent Labs    04/02/20 1544 04/03/20 0407 04/04/20 0243  DDIMER 1.18* 0.88* 0.77*  FERRITIN 392* 289 323*  LDH 332*  --   --   CRP 5.7* 8.0* 6.6*    Lab Results  Component Value Date   SARSCOV2NAA Detected (A) 03/29/2020   SpO2: 95 % O2 Flow Rate (L/min): 2 L/min  Presyncope: Due to dehydration.  Improving.  Hypothyroidism: On  Synthroid.  Vaccination status: Patient does not take COVID-19 vaccine.  She thinks he is healthy and does not require it.  We discussed about benefits and patient will consider in the future.     DVT prophylaxis: enoxaparin (LOVENOX) injection 40 mg Start: 04/02/20 2215   Code Status: Full code Family Communication: None.  Patient talking to the family. Disposition Plan: Status is: Inpatient  Remains inpatient appropriate because:IV treatments appropriate due to intensity of illness or inability to take PO and Inpatient level of care appropriate due to severity of illness   Dispo: The patient is from: Home              Anticipated d/c is to: Home              Anticipated d/c date is:  2 days               Patient currently is not medically stable to d/c.    Consultants:   None  Procedures:   None  Antimicrobials:  Antibiotics Given (last 72 hours)    Date/Time Action Medication Dose Rate   04/02/20 2344 New Bag/Given   remdesivir 200 mg in sodium chloride 0.9% 250 mL IVPB 200 mg 580 mL/hr   04/03/20 0932 New Bag/Given   remdesivir 100 mg in sodium chloride 0.9 % 100 mL IVPB 100 mg 200 mL/hr   04/04/20 1012 New Bag/Given   remdesivir 100 mg in sodium chloride 0.9 % 100 mL IVPB 100 mg 200 mL/hr         Subjective: Patient seen and examined.  No overnight events.  On 2 L of oxygen.  Been getting  around.  Has dry cough.  Afebrile.  She is more worried about her husband not taking care of himself at home.  Objective: Vitals:   04/04/20 1000 04/04/20 1100 04/04/20 1200 04/04/20 1300  BP: 122/67 122/71 116/70 137/74  Pulse: 79 80 85 (!) 101  Resp: 15 16 16 17   Temp:      TempSrc:      SpO2: 96% 94% 95% 95%  Weight:      Height:        Intake/Output Summary (Last 24 hours) at 04/04/2020 1412 Last data filed at 04/04/2020 1106 Gross per 24 hour  Intake 100 ml  Output --  Net 100 ml   Filed Weights   04/02/20 0917  Weight: 80.7 kg     Examination:  General exam: Appears calm and comfortable Slightly anxious.  Not in distress.  On 2 L oxygen. Respiratory system: Clear to auscultation. Respiratory effort normal.  No added sounds. Cardiovascular system: S1 & S2 heard, RRR. No JVD, murmurs, rubs, gallops or clicks. No pedal edema. Gastrointestinal system: Abdomen is nondistended, soft and nontender. No organomegaly or masses felt. Normal bowel sounds heard. Central nervous system: Alert and oriented. No focal neurological deficits. Extremities: Symmetric 5 x 5 power. Skin: No rashes, lesions or ulcers Psychiatry: Judgement and insight appear normal. Mood & affect appropriate.     Data Reviewed: I have personally reviewed following labs and imaging studies  CBC: Recent Labs  Lab 04/02/20 0931 04/03/20 0407 04/04/20 0243  WBC 6.1 4.6 6.1  NEUTROABS  --  3.5 4.6  HGB 14.6 12.4 12.6  HCT 41.7 37.0 37.7  MCV 89.3 90.2 89.3  PLT 238 217 268   Basic Metabolic Panel: Recent Labs  Lab 04/02/20 0931 04/03/20 0407 04/04/20 0243  NA 135 134* 138  K 3.6 3.8 4.3  CL 102 104 105  CO2 23 21* 22  GLUCOSE 116* 199* 151*  BUN <5* <5* 13  CREATININE 0.62 0.57 0.59  CALCIUM 8.7* 8.2* 9.1  MG  --  2.0 2.3  PHOS  --  2.6 3.3   GFR: Estimated Creatinine Clearance: 77.8 mL/min (by C-G formula based on SCr of 0.59 mg/dL). Liver Function Tests: Recent Labs  Lab 04/02/20 1544 04/03/20 0407 04/04/20 0243  AST 25 20 18   ALT 20 17 20   ALKPHOS 63 59 57  BILITOT 0.9 0.6 0.6  PROT 7.4 6.2* 6.7  ALBUMIN 3.5 2.8* 3.0*   No results for input(s): LIPASE, AMYLASE in the last 168 hours. No results for input(s): AMMONIA in the last 168 hours. Coagulation Profile: No results for input(s): INR, PROTIME in the last 168 hours. Cardiac Enzymes: No results for input(s): CKTOTAL, CKMB, CKMBINDEX, TROPONINI in the last 168 hours. BNP (last 3 results) No results for input(s): PROBNP in the last 8760 hours. HbA1C: No results  for input(s): HGBA1C in the last 72 hours. CBG: No results for input(s): GLUCAP in the last 168 hours. Lipid Profile: Recent Labs    04/02/20 1526  TRIG 102   Thyroid Function Tests: Recent Labs    04/02/20 2105  TSH 2.345   Anemia Panel: Recent Labs    04/03/20 0407 04/04/20 0243  FERRITIN 289 323*   Sepsis Labs: Recent Labs  Lab 04/02/20 1526 04/02/20 1544  PROCALCITON  --  <0.10  LATICACIDVEN 1.5  --     Recent Results (from the past 240 hour(s))  Novel Coronavirus, NAA ([facility])     Status: Abnormal   Collection Time: 03/29/20  8:42 AM   Specimen: Nasopharyngeal(NP) swabs in vial transport medium   Nasopharynge  Patient  Result Value Ref Range Status   SARS-CoV-2, NAA Detected (A) Not Detected Final    Comment: Patients who have a positive COVID-19 test result may now have treatment options. Treatment options are available for patients with mild to moderate symptoms and for hospitalized patients. Visit our website at [email or web address] for resources and information. This nucleic acid amplification test was developed and its performance characteristics determined by [employer]. Nucleic acid amplification tests include RT-PCR and TMA. This test has not been FDA cleared or approved. This test has been authorized by FDA under an Emergency Use Authorization (EUA). This test is only authorized for the duration of time the declaration that circumstances exist justifying the authorization of the emergency use of in vitro diagnostic tests for detection of SARS-CoV-2 virus and/or diagnosis of COVID-19 infection under section 564(b)(1) of the Act, 21 U.S.C. 409WJX-9(J) (1), unless the authorization is terminated or revoked sooner. When diagnostic testing is negativ e, the possibility of a false negative result should be considered in the context of a patient's recent exposures and the presence of clinical signs and symptoms consistent with  COVID-19. An individual without symptoms of COVID-19 and who is not shedding SARS-CoV-2 virus would expect to have a negative (not detected) result in this assay.   SARS-COV-2, NAA 2 DAY TAT     Status: None   Collection Time: 03/29/20  8:42 AM   Nasopharynge  Patient  Result Value Ref Range Status   SARS-CoV-2, NAA 2 DAY TAT Performed  Final  Blood Culture (routine x 2)     Status: None (Preliminary result)   Collection Time: 04/02/20  3:49 PM   Specimen: BLOOD  Result Value Ref Range Status   Specimen Description BLOOD LEFT ANTECUBITAL  Final   Special Requests   Final    BOTTLES DRAWN AEROBIC AND ANAEROBIC Blood Culture adequate volume   Culture   Final    NO GROWTH < 12 HOURS Performed at [facility], [address] [place], [place] [address]    Report Status PENDING  Incomplete         Radiology Studies: CT Angio Chest PE W/Cm &/Or Wo Cm  Result Date: 04/02/2020 CLINICAL DATA:  Weakness and syncope. EXAM: CT ANGIOGRAPHY CHEST WITH CONTRAST TECHNIQUE: Multidetector CT imaging of the chest was performed using the standard protocol during bolus administration of intravenous contrast. Multiplanar CT image reconstructions and MIPs were obtained to evaluate the vascular anatomy. CONTRAST:  OMNIPAQUE IOHEXOL 300 MG/ML  SOLN COMPARISON:  April 09, 2018 FINDINGS: Cardiovascular: Satisfactory opacification of the pulmonary arteries to the segmental level. No evidence of pulmonary embolism. Normal heart size. No pericardial effusion. Mediastinum/Nodes: There is mild bilateral hilar lymphadenopathy. Thyroid gland, trachea, and esophagus demonstrate no significant findings. Lungs/Pleura: Moderate to marked severity infiltrates are seen within the posterior aspects of the bilateral lower lobes and posterolateral aspect of the left upper lobe. Small multifocal infiltrates are seen within the right upper lobe and right middle lobe. A 7 mm noncalcified right upper lobe lung nodule is  present (axial CT image 34, CT series number 7). This represents a new finding when compared to the prior exam. Upper Abdomen: No acute abnormality. Musculoskeletal: No chest wall abnormality. No acute or significant osseous findings. Review of the MIP images confirms the above findings. IMPRESSION: 1. No evidence of pulmonary embolus. 2. Moderate to marked severity infiltrates within the posterior  aspects of the bilateral lower lobes and posterolateral aspect of the left upper lobe, with small multifocal infiltrates within the right upper lobe and right middle lobe. Findings are consistent with multifocal pneumonia. 3. 7 mm noncalcified right upper lobe lung nodule. This represents a new finding when compared to the prior exam. Recommend follow-up chest CT in 3 months. Electronically Signed   By: Aram Candelahaddeus  Houston M.D.   On: 04/02/2020 22:23   DG Chest Port 1 View  Result Date: 04/02/2020 CLINICAL DATA:  COVID positive, syncope. EXAM: PORTABLE CHEST 1 VIEW COMPARISON:  Prior chest radiographs 03/29/2020 and earlier. FINDINGS: Heart size within normal limits. New from the prior examination, there is airspace disease within the left mid lung field. Redemonstrated ill-defined airspace disease within the left lung base. No evidence of pleural effusion or pneumothorax. No acute bony abnormality identified. IMPRESSION: Airspace disease within the left mid lung which is new as compared to the prior radiographs of 03/29/2020. Redemonstrated ill-defined airspace disease within the left lung base. Findings likely reflect sequela of atypical/viral pneumonia given the provided history of COVID positivity. Radiographic follow-up to resolution is recommended. Electronically Signed   By: Jackey LogeKyle  Golden DO   On: 04/02/2020 15:52        Scheduled Meds:  albuterol  2 puff Inhalation Q6H   vitamin C  500 mg Oral Daily   dexamethasone (DECADRON) injection  6 mg Intravenous QHS   enoxaparin (LOVENOX) injection  40 mg  Subcutaneous QHS   levothyroxine  125 mcg Oral QAC breakfast   sodium chloride  1-2 spray Each Nare Q2H   sodium chloride flush  3 mL Intravenous Q12H   zinc sulfate  220 mg Oral Daily   Continuous Infusions:  remdesivir 100 mg in NS 100 mL Stopped (04/04/20 1106)     LOS: 2 days    Time spent: 30 minutes    Dorcas CarrowKuber Malin Cervini, MD [facility] Pager [phone]

## 2020-04-04 NOTE — ED Notes (Signed)
Dinner tray provided

## 2020-04-04 NOTE — ED Notes (Signed)
Provided pt w/breakfast tray. 

## 2020-04-04 NOTE — Plan of Care (Signed)
Discussed with patient plan of care, pain management and admission questions with some teach back displayed 

## 2020-04-05 LAB — CBC WITH DIFFERENTIAL/PLATELET
Abs Immature Granulocytes: 0.22 10*3/uL — ABNORMAL HIGH (ref 0.00–0.07)
Basophils Absolute: 0 10*3/uL (ref 0.0–0.1)
Basophils Relative: 0 %
Eosinophils Absolute: 0 10*3/uL (ref 0.0–0.5)
Eosinophils Relative: 0 %
HCT: 37.8 % (ref 36.0–46.0)
Hemoglobin: 12.9 g/dL (ref 12.0–15.0)
Immature Granulocytes: 2 %
Lymphocytes Relative: 11 %
Lymphs Abs: 1.1 10*3/uL (ref 0.7–4.0)
MCH: 30.1 pg (ref 26.0–34.0)
MCHC: 34.1 g/dL (ref 30.0–36.0)
MCV: 88.1 fL (ref 80.0–100.0)
Monocytes Absolute: 0.5 10*3/uL (ref 0.1–1.0)
Monocytes Relative: 5 %
Neutro Abs: 8.7 10*3/uL — ABNORMAL HIGH (ref 1.7–7.7)
Neutrophils Relative %: 82 %
Platelets: 326 10*3/uL (ref 150–400)
RBC: 4.29 MIL/uL (ref 3.87–5.11)
RDW: 12.8 % (ref 11.5–15.5)
WBC: 10.6 10*3/uL — ABNORMAL HIGH (ref 4.0–10.5)
nRBC: 0 % (ref 0.0–0.2)

## 2020-04-05 LAB — COMPREHENSIVE METABOLIC PANEL
ALT: 19 U/L (ref 0–44)
AST: 20 U/L (ref 15–41)
Albumin: 3.3 g/dL — ABNORMAL LOW (ref 3.5–5.0)
Alkaline Phosphatase: 60 U/L (ref 38–126)
Anion gap: 12 (ref 5–15)
BUN: 12 mg/dL (ref 6–20)
CO2: 21 mmol/L — ABNORMAL LOW (ref 22–32)
Calcium: 9.1 mg/dL (ref 8.9–10.3)
Chloride: 103 mmol/L (ref 98–111)
Creatinine, Ser: 0.6 mg/dL (ref 0.44–1.00)
GFR calc Af Amer: >60 mL/min (ref >60)
GFR calc non Af Amer: >60 mL/min (ref >60)
Glucose, Bld: 170 mg/dL — ABNORMAL HIGH (ref 70–99)
Potassium: 4.2 mmol/L (ref 3.5–5.1)
Sodium: 136 mmol/L (ref 135–145)
Total Bilirubin: 0.6 mg/dL (ref 0.3–1.2)
Total Protein: 7 g/dL (ref 6.5–8.1)

## 2020-04-05 LAB — C-REACTIVE PROTEIN: CRP: 2.2 mg/dL — ABNORMAL HIGH (ref <1.0)

## 2020-04-05 LAB — D-DIMER, QUANTITATIVE: D-Dimer, Quant: 0.93 ug/mL-FEU — ABNORMAL HIGH (ref 0.00–0.50)

## 2020-04-05 LAB — PHOSPHORUS: Phosphorus: 3.2 mg/dL (ref 2.5–4.6)

## 2020-04-05 LAB — MAGNESIUM: Magnesium: 2.2 mg/dL (ref 1.7–2.4)

## 2020-04-05 LAB — FERRITIN: Ferritin: 343 ng/mL — ABNORMAL HIGH (ref 11–307)

## 2020-04-05 MED ORDER — DEXAMETHASONE 6 MG PO TABS: 6.0000 mg | ORAL_TABLET | Freq: Every day | ORAL | 0 refills | Status: AC

## 2020-04-05 NOTE — Discharge Instructions (Signed)
Patient scheduled for outpatient Remdesivir infusions at 8:30 am on Monday 8/23 at [facility]. Please inform the patient to park at [address] [place], [place], as staff will be escorting the patient through the east entrance of the hospital.    There is a wave flag banner located near the entrance on [address]. Turn into this entrance and immediately turn left and park in 1 of the 5 designated Covid Infusion Parking spots. There is a phone number on the sign, please call and let the staff know what spot you are in and we will come out and get you. For questions call [phone].  Thanks.  * If patient is getting dropped off, you may have your ride pull up to the Main Entrance of [facility]. Please stay in the car and call [phone], staff will meet you at your car and escort you into the hospital and back to the clinic.

## 2020-04-05 NOTE — Progress Notes (Signed)
Patient scheduled for outpatient Remdesivir infusions at 8:30 am on Monday 8/23 at  Hospital. Please inform the patient to park at [address], [place], as staff will be escorting the patient through the east entrance of the hospital.    There is a wave flag banner located near the entrance on [address] Turn into this entrance and immediately turn left and park in 1 of the 5 designated Covid Infusion Parking spots. There is a phone number on the sign, please call and let the staff know what spot you are in and we will come out and get you. For questions call [phone].  Thanks.  * If patient is getting dropped off, you may have your ride pull up to the Main Entrance of  hospital. Please stay in the car and call [phone], staff will meet you at your car and escort you into the hospital and back to the clinic.   

## 2020-04-05 NOTE — Plan of Care (Signed)
Pt is adequate for D/C. °

## 2020-04-05 NOTE — Discharge Summary (Signed)
Physician Discharge Summary  Summer Roberts AJO:[id number] DOB: 11-Jul-1961 DOA: 04/02/2020  PCP: Kathyrn Sheriff, MD  Admit date: 04/02/2020 Discharge date: 04/05/2020  Admitted From: Home Disposition: Home with outpatient infusion  Recommendations for Outpatient Follow-up:  1. Follow up with PCP in 1-2 weeks 2. Please obtain BMP/CBC in one week    Discharge Condition: Stable CODE STATUS: Full code Diet recommendation: Low-salt diet  Discharge summary: 59 year old female with history of hypothyroidism presented to the ER with progressive generalized weakness, chills and cough.  Recently treated for upper respiratory tract infection with azithromycin and prednisone with no improvement.  At the emergency room, 87% on ambulation.  CTA chest showed bilateral pneumonia and extensive groundglass opacities but no PE.  She was diagnosed with COVID-19 virus pneumonia and hypoxia and admitted to the hospital treated aggressively with IV steroids and remdesivir day 4/5 today, aggressive chest physiotherapy and supportive treatments with improvement.  Today patient has adequate improvement and she is on room air and able to mobilize.  Her symptoms have improved.  She will be scheduled to come to infusion clinic tomorrow to finish her fifth dose of remdesivir.  She will use over-the-counter cough medications and Tylenol.  She will finish 7 more days of oral steroids. She will resume all her long-term medications.  Extensive counseling done regarding taking COVID-19 vaccination which is already proved to be effective and safe.  She can take her vaccination once her symptoms improve from this infection.  Discharge Diagnoses:  Principal Problem:   Acute hypoxemic respiratory failure due to COVID-19 [facility]) Active Problems:   Hypothyroidism    Discharge Instructions  Discharge Instructions    Call MD for:  difficulty breathing, headache or visual disturbances   Complete by: As directed    Call MD for:   temperature >100.4   Complete by: As directed    Diet - low sodium heart healthy   Complete by: As directed    Discharge instructions   Complete by: As directed    You can use over-the-counter cough medications and Tylenol. Total 3 weeks of isolation from your initial positive test. As your healthcare provider, I highly encourage you to get your COVID-19 vaccination once your symptoms improved from this infection episodes.   Increase activity slowly   Complete by: As directed      Allergies as of 04/05/2020   No Known Allergies     Medication List    STOP taking these medications   azithromycin 250 MG tablet Commonly known as: ZITHROMAX   famotidine 40 MG tablet Commonly known as: PEPCID   LORazepam 1 MG tablet Commonly known as: Ativan     TAKE these medications   acetaminophen 500 MG tablet Commonly known as: TYLENOL Take 1,000 mg by mouth every 6 (six) hours as needed for mild pain.   aspirin EC 81 MG tablet Take 81 mg by mouth daily as needed (for pain or headaches).   benzonatate 100 MG capsule Commonly known as: TESSALON Take 1 capsule (100 mg total) by mouth every 8 (eight) hours.   dexamethasone 6 MG tablet Commonly known as: DECADRON Take 1 tablet (6 mg total) by mouth daily for 6 days.   Synthroid 100 MCG tablet Generic drug: levothyroxine Take 100 mcg by mouth daily.   VALERIAN ROOT PO Take 2 capsules by mouth 2 (two) times daily.       Follow-up Information    Kathyrn Sheriff, MD Follow up in 2 week(s).   Specialty: Family Medicine Contact information:  [id number] [id number] [phone]              No Known Allergies  Procedures/Studies: DG Chest 2 View  Result Date: 03/29/2020 CLINICAL DATA:  Patient with worsening shortness of breath.  Cough. EXAM: CHEST - 2 VIEW COMPARISON:  Chest radiograph 03/26/2020 FINDINGS: Interval development of retrocardiac consolidation. Stable cardiac  and mediastinal contours. No pleural effusion or pneumothorax. Unremarkable osseous structures. IMPRESSION: New retrocardiac consolidation concerning for pneumonia. Followup PA and lateral chest X-ray is recommended in 3-4 weeks following trial of antibiotic therapy to ensure resolution and exclude underlying malignancy. Electronically Signed   By: Annia Beltrew  Davis M.D.   On: 03/29/2020 09:05   DG Chest 2 View  Result Date: 03/26/2020 CLINICAL DATA:  Cough for 2 days EXAM: CHEST - 2 VIEW COMPARISON:  05/02/2018 FINDINGS: Cardiac shadow is stable. Lungs are well aerated bilaterally. Mild increased interstitial markings are noted without focal infiltrate or sizable effusion. No bony abnormality is noted. IMPRESSION: Increased interstitial markings likely related to bronchitis. Electronically Signed   By: Alcide CleverMark  Lukens M.D.   On: 03/26/2020 11:26   CT Angio Chest PE W/Cm &/Or Wo Cm  Result Date: 04/02/2020 CLINICAL DATA:  Weakness and syncope. EXAM: CT ANGIOGRAPHY CHEST WITH CONTRAST TECHNIQUE: Multidetector CT imaging of the chest was performed using the standard protocol during bolus administration of intravenous contrast. Multiplanar CT image reconstructions and MIPs were obtained to evaluate the vascular anatomy. CONTRAST:  100mL OMNIPAQUE IOHEXOL 300 MG/ML  SOLN COMPARISON:  April 09, 2018 FINDINGS: Cardiovascular: Satisfactory opacification of the pulmonary arteries to the segmental level. No evidence of pulmonary embolism. Normal heart size. No pericardial effusion. Mediastinum/Nodes: There is mild bilateral hilar lymphadenopathy. Thyroid gland, trachea, and esophagus demonstrate no significant findings. Lungs/Pleura: Moderate to marked severity infiltrates are seen within the posterior aspects of the bilateral lower lobes and posterolateral aspect of the left upper lobe. Small multifocal infiltrates are seen within the right upper lobe and right middle lobe. A 7 mm noncalcified right upper lobe lung nodule  is present (axial CT image 34, CT series number 7). This represents a new finding when compared to the prior exam. Upper Abdomen: No acute abnormality. Musculoskeletal: No chest wall abnormality. No acute or significant osseous findings. Review of the MIP images confirms the above findings. IMPRESSION: 1. No evidence of pulmonary embolus. 2. Moderate to marked severity infiltrates within the posterior aspects of the bilateral lower lobes and posterolateral aspect of the left upper lobe, with small multifocal infiltrates within the right upper lobe and right middle lobe. Findings are consistent with multifocal pneumonia. 3. 7 mm noncalcified right upper lobe lung nodule. This represents a new finding when compared to the prior exam. Recommend follow-up chest CT in 3 months. Electronically Signed   By: Aram Candelahaddeus  Houston M.D.   On: 04/02/2020 22:23   DG Chest Port 1 View  Result Date: 04/02/2020 CLINICAL DATA:  COVID positive, syncope. EXAM: PORTABLE CHEST 1 VIEW COMPARISON:  Prior chest radiographs 03/29/2020 and earlier. FINDINGS: Heart size within normal limits. New from the prior examination, there is airspace disease within the left mid lung field. Redemonstrated ill-defined airspace disease within the left lung base. No evidence of pleural effusion or pneumothorax. No acute bony abnormality identified. IMPRESSION: Airspace disease within the left mid lung which is new as compared to the prior radiographs of 03/29/2020. Redemonstrated ill-defined airspace disease within the left lung base. Findings likely reflect sequela of atypical/viral  pneumonia given the provided history of COVID positivity. Radiographic follow-up to resolution is recommended. Electronically Signed   By: Jackey Loge DO   On: 04/02/2020 15:52   (Echo, Carotid, EGD, Colonoscopy, ERCP)    Subjective: Patient seen and examined.  No overnight events.  She is so excited to go home.  Has some dry cough.  She was able to go around the room,  use bathroom and take shower with minimal shortness of breath but not in any difficulties.  Afebrile. She has a ride to come to infusion clinic tomorrow.   Discharge Exam: Vitals:   04/04/20 2255 04/05/20 0849  BP: (!) 151/83 118/72  Pulse: 90 (!) 108  Resp: 19 16  Temp: 98.1 F (36.7 C) 98 F (36.7 C)  SpO2: 94% 93%   Vitals:   04/04/20 2243 04/04/20 2254 04/04/20 2255 04/05/20 0849  BP:  (!) 151/83 (!) 151/83 118/72  Pulse:  92 90 (!) 108  Resp:  18 19 16   Temp: 97.7 F (36.5 C) 98.1 F (36.7 C) 98.1 F (36.7 C) 98 F (36.7 C)  TempSrc: Oral Oral  Oral  SpO2:  94% 94% 93%  Weight:      Height:        General: Pt is alert, awake, not in acute distress Walking in the room. Cardiovascular: RRR, S1/S2 +, no rubs, no gallops Respiratory: CTA bilaterally, no wheezing, no rhonchi, some conducted airway sounds. Abdominal: Soft, NT, ND, bowel sounds + Extremities: no edema, no cyanosis    The results of significant diagnostics from this hospitalization (including imaging, microbiology, ancillary and laboratory) are listed below for reference.     Microbiology: Recent Results (from the past 240 hour(s))  Novel Coronavirus, NAA ([facility])     Status: Abnormal   Collection Time: 03/29/20  8:42 AM   Specimen: Nasopharyngeal(NP) swabs in vial transport medium   Nasopharynge  Patient  Result Value Ref Range Status   SARS-CoV-2, NAA Detected (A) Not Detected Final    Comment: Patients who have a positive COVID-19 test result may now have treatment options. Treatment options are available for patients with mild to moderate symptoms and for hospitalized patients. Visit our website at 03/31/20 for resources and information. This nucleic acid amplification test was developed and its performance characteristics determined by [employer]. Nucleic acid amplification tests include RT-PCR and TMA. This test has not been FDA cleared or approved. This  test has been authorized by FDA under an Emergency Use Authorization (EUA). This test is only authorized for the duration of time the declaration that circumstances exist justifying the authorization of the emergency use of in vitro diagnostic tests for detection of SARS-CoV-2 virus and/or diagnosis of COVID-19 infection under section 564(b)(1) of the Act, 21 U.S.C. [employer]) (1), unless the authorization is terminated or revoked sooner. When diagnostic testing is negativ e, the possibility of a false negative result should be considered in the context of a patient's recent exposures and the presence of clinical signs and symptoms consistent with COVID-19. An individual without symptoms of COVID-19 and who is not shedding SARS-CoV-2 virus would expect to have a negative (not detected) result in this assay.   SARS-COV-2, NAA 2 DAY TAT     Status: None   Collection Time: 03/29/20  8:42 AM   Nasopharynge  Patient  Result Value Ref Range Status   SARS-CoV-2, NAA 2 DAY TAT Performed  Final  Blood Culture (routine x 2)     Status: None (Preliminary result)  Collection Time: 04/02/20  3:49 PM   Specimen: BLOOD  Result Value Ref Range Status   Specimen Description BLOOD LEFT ANTECUBITAL  Final   Special Requests   Final    BOTTLES DRAWN AEROBIC AND ANAEROBIC Blood Culture adequate volume   Culture   Final    NO GROWTH 3 DAYS Performed at [facility], [address] [place], [place] [address]    Report Status PENDING  Incomplete  Blood Culture (routine x 2)     Status: None (Preliminary result)   Collection Time: 04/03/20  1:43 AM   Specimen: BLOOD LEFT ARM  Result Value Ref Range Status   Specimen Description BLOOD LEFT ARM  Final   Special Requests   Final    BOTTLES DRAWN AEROBIC AND ANAEROBIC Blood Culture adequate volume   Culture   Final    NO GROWTH 2 DAYS Performed at [facility], [address] [place], [place] [address]    Report Status PENDING   Incomplete     Labs: BNP (last 3 results) No results for input(s): BNP in the last 8760 hours. Basic Metabolic Panel: Recent Labs  Lab 04/02/20 0931 04/03/20 0407 04/04/20 0243 04/05/20 0142  NA 135 134* 138 136  K 3.6 3.8 4.3 4.2  CL 102 104 105 103  CO2 23 21* 22 21*  GLUCOSE 116* 199* 151* 170*  BUN <5* <5* 13 12  CREATININE 0.62 0.57 0.59 0.60  CALCIUM 8.7* 8.2* 9.1 9.1  MG  --  2.0 2.3 2.2  PHOS  --  2.6 3.3 3.2   Liver Function Tests: Recent Labs  Lab 04/02/20 1544 04/03/20 0407 04/04/20 0243 04/05/20 0142  AST 25 20 18 20   ALT 20 17 20 19   ALKPHOS 63 59 57 60  BILITOT 0.9 0.6 0.6 0.6  PROT 7.4 6.2* 6.7 7.0  ALBUMIN 3.5 2.8* 3.0* 3.3*   No results for input(s): LIPASE, AMYLASE in the last 168 hours. No results for input(s): AMMONIA in the last 168 hours. CBC: Recent Labs  Lab 04/02/20 0931 04/03/20 0407 04/04/20 0243 04/05/20 0142  WBC 6.1 4.6 6.1 10.6*  NEUTROABS  --  3.5 4.6 8.7*  HGB 14.6 12.4 12.6 12.9  HCT 41.7 37.0 37.7 37.8  MCV 89.3 90.2 89.3 88.1  PLT 238 217 268 326   Cardiac Enzymes: No results for input(s): CKTOTAL, CKMB, CKMBINDEX, TROPONINI in the last 168 hours. BNP: Invalid input(s): POCBNP CBG: No results for input(s): GLUCAP in the last 168 hours. D-Dimer Recent Labs    04/04/20 0243 04/05/20 0142  DDIMER 0.77* 0.93*   Hgb A1c No results for input(s): HGBA1C in the last 72 hours. Lipid Profile Recent Labs    04/02/20 1526  TRIG 102   Thyroid function studies Recent Labs    04/02/20 2105  TSH 2.345   Anemia work up Recent Labs    04/04/20 0243 04/05/20 0142  FERRITIN 323* 343*   Urinalysis    Component Value Date/Time   COLORURINE STRAW (A) 04/03/2020 0935   APPEARANCEUR CLEAR 04/03/2020 0935   LABSPEC 1.009 04/03/2020 0935   PHURINE 7.0 04/03/2020 0935   GLUCOSEU NEGATIVE 04/03/2020 0935   HGBUR NEGATIVE 04/03/2020 0935   BILIRUBINUR NEGATIVE 04/03/2020 0935   KETONESUR NEGATIVE 04/03/2020 0935    PROTEINUR NEGATIVE 04/03/2020 0935   NITRITE NEGATIVE 04/03/2020 0935   LEUKOCYTESUR NEGATIVE 04/03/2020 0935   Sepsis Labs Invalid input(s): PROCALCITONIN,  WBC,  LACTICIDVEN Microbiology Recent Results (from the past 240  hour(s))  Novel Coronavirus, NAA ([facility])     Status: Abnormal   Collection Time: 03/29/20  8:42 AM   Specimen: Nasopharyngeal(NP) swabs in vial transport medium   Nasopharynge  Patient  Result Value Ref Range Status   SARS-CoV-2, NAA Detected (A) Not Detected Final    Comment: Patients who have a positive COVID-19 test result may now have treatment options. Treatment options are available for patients with mild to moderate symptoms and for hospitalized patients. Visit our website at [email or web address] for resources and information. This nucleic acid amplification test was developed and its performance characteristics determined by [employer]. Nucleic acid amplification tests include RT-PCR and TMA. This test has not been FDA cleared or approved. This test has been authorized by FDA under an Emergency Use Authorization (EUA). This test is only authorized for the duration of time the declaration that circumstances exist justifying the authorization of the emergency use of in vitro diagnostic tests for detection of SARS-CoV-2 virus and/or diagnosis of COVID-19 infection under section 564(b)(1) of the Act, 21 U.S.C. 409WJX-9(J) (1), unless the authorization is terminated or revoked sooner. When diagnostic testing is negativ e, the possibility of a false negative result should be considered in the context of a patient's recent exposures and the presence of clinical signs and symptoms consistent with COVID-19. An individual without symptoms of COVID-19 and who is not shedding SARS-CoV-2 virus would expect to have a negative (not detected) result in this assay.   SARS-COV-2, NAA 2 DAY TAT     Status: None   Collection Time: 03/29/20  8:42  AM   Nasopharynge  Patient  Result Value Ref Range Status   SARS-CoV-2, NAA 2 DAY TAT Performed  Final  Blood Culture (routine x 2)     Status: None (Preliminary result)   Collection Time: 04/02/20  3:49 PM   Specimen: BLOOD  Result Value Ref Range Status   Specimen Description BLOOD LEFT ANTECUBITAL  Final   Special Requests   Final    BOTTLES DRAWN AEROBIC AND ANAEROBIC Blood Culture adequate volume   Culture   Final    NO GROWTH 3 DAYS Performed at [facility], [address] [place], [place] [address]    Report Status PENDING  Incomplete  Blood Culture (routine x 2)     Status: None (Preliminary result)   Collection Time: 04/03/20  1:43 AM   Specimen: BLOOD LEFT ARM  Result Value Ref Range Status   Specimen Description BLOOD LEFT ARM  Final   Special Requests   Final    BOTTLES DRAWN AEROBIC AND ANAEROBIC Blood Culture adequate volume   Culture   Final    NO GROWTH 2 DAYS Performed at [facility], [address] [place], [place] [address]    Report Status PENDING  Incomplete     Time coordinating discharge:  40 minutes  SIGNED:   Dorcas Carrow, MD  [facility] 04/05/2020, 10:28 AM

## 2020-04-06 ENCOUNTER — Ambulatory Visit (HOSPITAL_COMMUNITY)
Admit: 2020-04-06 | Discharge: 2020-04-06 | Disposition: A | Discharge status: Home / Self Care | Payer: 59 | Attending: Pulmonary Disease | Admitting: Pulmonary Disease

## 2020-04-06 DIAGNOSIS — J1282 Pneumonia due to coronavirus disease 2019: Secondary | ICD-10-CM | POA: Diagnosis not present

## 2020-04-06 DIAGNOSIS — U071 COVID-19: Secondary | ICD-10-CM | POA: Diagnosis not present

## 2020-04-06 MED ORDER — SODIUM CHLORIDE 0.9 % IV SOLN: INTRAVENOUS | Status: DC | PRN

## 2020-04-06 MED ORDER — METHYLPREDNISOLONE SODIUM SUCC 125 MG IJ SOLR: 125.0000 mg | Freq: Once | INTRAMUSCULAR | Status: DC | PRN

## 2020-04-06 MED ORDER — FAMOTIDINE IN NACL 20-0.9 MG/50ML-% IV SOLN: 20.0000 mg | Freq: Once | INTRAVENOUS | Status: DC | PRN

## 2020-04-06 MED ORDER — EPINEPHRINE 0.3 MG/0.3ML IJ SOAJ: 0.3000 mg | Freq: Once | INTRAMUSCULAR | Status: DC | PRN

## 2020-04-06 MED ORDER — ALBUTEROL SULFATE HFA 108 (90 BASE) MCG/ACT IN AERS: 2.0000 | INHALATION_SPRAY | Freq: Once | RESPIRATORY_TRACT | Status: DC | PRN

## 2020-04-06 MED ORDER — DIPHENHYDRAMINE HCL 50 MG/ML IJ SOLN: 50.0000 mg | Freq: Once | INTRAMUSCULAR | Status: DC | PRN

## 2020-04-06 MED ORDER — SODIUM CHLORIDE 0.9 % IV SOLN
100.0000 mg | Freq: Once | INTRAVENOUS | Status: AC
  Administered 2020-04-06: 100 mg via INTRAVENOUS
  Filled 2020-04-06: qty 20

## 2020-04-06 NOTE — Discharge Instructions (Signed)
10 Things You Can Do to Manage Your COVID-19 Symptoms at Home If you have possible or confirmed COVID-19: 1. Stay home from work and school. And stay away from other public places. If you must go out, avoid using any kind of public transportation, ridesharing, or taxis. 2. Monitor your symptoms carefully. If your symptoms get worse, call your healthcare provider immediately. 3. Get rest and stay hydrated. 4. If you have a medical appointment, call the healthcare provider ahead of time and tell them that you have or may have COVID-19. 5. For medical emergencies, call 911 and notify the dispatch personnel that you have or may have COVID-19. 6. Cover your cough and sneezes with a tissue or use the inside of your elbow. 7. Wash your hands often with soap and water for at least 20 seconds or clean your hands with an alcohol-based hand sanitizer that contains at least 60% alcohol. 8. As much as possible, stay in a specific room and away from other people in your home. Also, you should use a separate bathroom, if available. If you need to be around other people in or outside of the home, wear a mask. 9. Avoid sharing personal items with other people in your household, like dishes, towels, and bedding. 10. Clean all surfaces that are touched often, like counters, tabletops, and doorknobs. Use household cleaning sprays or wipes according to the label instructions. cdc.gov/coronavirus 02/13/2019 This information is not intended to replace advice given to you by your health care provider. Make sure you discuss any questions you have with your health care provider. Document Revised: 07/18/2019 Document Reviewed: 07/18/2019 Elsevier Patient Education  2020 [employer]vier Inc[employer]  

## 2020-04-06 NOTE — Progress Notes (Signed)
  Diagnosis: COVID-19  Physician:Dr Wright   Procedure: [facility] Infusion Clinic Med: remdesivir infusion - Provided patient with remdesivir fact sheet for patients, parents and caregivers prior to infusion.  Complications: No immediate complications noted.  Discharge: Discharged home   Summer Roberts W 04/06/2020  

## 2020-04-07 LAB — CULTURE, BLOOD (ROUTINE X 2)
Culture: NO GROWTH
Special Requests: ADEQUATE

## 2020-04-08 LAB — CULTURE, BLOOD (ROUTINE X 2)
Culture: NO GROWTH
Special Requests: ADEQUATE

## 2020-04-23 ENCOUNTER — Ambulatory Visit (INDEPENDENT_AMBULATORY_CARE_PROVIDER_SITE_OTHER): Payer: 59

## 2020-04-23 ENCOUNTER — Ambulatory Visit (HOSPITAL_COMMUNITY)
Admission: EM | Admit: 2020-04-23 | Discharge: 2020-04-23 | Disposition: A | Discharge status: Home / Self Care | Payer: 59 | Attending: Family Medicine | Admitting: Family Medicine

## 2020-04-23 ENCOUNTER — Encounter (HOSPITAL_COMMUNITY): Payer: Self-pay | Admitting: Emergency Medicine

## 2020-04-23 ENCOUNTER — Other Ambulatory Visit: Payer: Self-pay

## 2020-04-23 DIAGNOSIS — R079 Chest pain, unspecified: Secondary | ICD-10-CM

## 2020-04-23 DIAGNOSIS — R0602 Shortness of breath: Secondary | ICD-10-CM | POA: Diagnosis not present

## 2020-04-23 DIAGNOSIS — F411 Generalized anxiety disorder: Secondary | ICD-10-CM | POA: Insufficient documentation

## 2020-04-23 LAB — COMPREHENSIVE METABOLIC PANEL
ALT: 20 U/L (ref 0–44)
AST: 22 U/L (ref 15–41)
Albumin: 4 g/dL (ref 3.5–5.0)
Alkaline Phosphatase: 51 U/L (ref 38–126)
Anion gap: 12 (ref 5–15)
BUN: 7 mg/dL (ref 6–20)
CO2: 23 mmol/L (ref 22–32)
Calcium: 9.5 mg/dL (ref 8.9–10.3)
Chloride: 108 mmol/L (ref 98–111)
Creatinine, Ser: 0.47 mg/dL (ref 0.44–1.00)
GFR calc Af Amer: >60 mL/min (ref >60)
GFR calc non Af Amer: >60 mL/min (ref >60)
Glucose, Bld: 101 mg/dL — ABNORMAL HIGH (ref 70–99)
Potassium: 3.9 mmol/L (ref 3.5–5.1)
Sodium: 143 mmol/L (ref 135–145)
Total Bilirubin: 0.8 mg/dL (ref 0.3–1.2)
Total Protein: 7.1 g/dL (ref 6.5–8.1)

## 2020-04-23 LAB — CBC WITH DIFFERENTIAL/PLATELET
Abs Immature Granulocytes: 0.01 10*3/uL (ref 0.00–0.07)
Basophils Absolute: 0 10*3/uL (ref 0.0–0.1)
Basophils Relative: 1 %
Eosinophils Absolute: 0 10*3/uL (ref 0.0–0.5)
Eosinophils Relative: 1 %
HCT: 38.3 % (ref 36.0–46.0)
Hemoglobin: 12.8 g/dL (ref 12.0–15.0)
Immature Granulocytes: 0 %
Lymphocytes Relative: 30 %
Lymphs Abs: 1.3 10*3/uL (ref 0.7–4.0)
MCH: 30.8 pg (ref 26.0–34.0)
MCHC: 33.4 g/dL (ref 30.0–36.0)
MCV: 92.1 fL (ref 80.0–100.0)
Monocytes Absolute: 0.3 10*3/uL (ref 0.1–1.0)
Monocytes Relative: 8 %
Neutro Abs: 2.6 10*3/uL (ref 1.7–7.7)
Neutrophils Relative %: 60 %
Platelets: 167 10*3/uL (ref 150–400)
RBC: 4.16 MIL/uL (ref 3.87–5.11)
RDW: 14.7 % (ref 11.5–15.5)
WBC: 4.4 10*3/uL (ref 4.0–10.5)
nRBC: 0 % (ref 0.0–0.2)

## 2020-04-23 LAB — TSH: TSH: 0.731 u[IU]/mL (ref 0.350–4.500)

## 2020-04-23 NOTE — ED Provider Notes (Signed)
[facility]    CSN: [id number] Arrival date & time: 04/23/20  0818      History   Chief Complaint Chief Complaint  Patient presents with  . Shortness of Breath    HPI Summer Roberts is a 59 y.o. female.   Patient is a 59 year old female with past medical history of thyroid disease, COVID-19, respiratory failure due to COVID-19.  Patient is here with left-sided chest discomfort with radiation to left arm, mild shortness of breath, lower back pain and weakness.  Symptoms started last night.  Reported she took 3 melatonin to help rest and then started having this chest discomfort, panic anxiety. Tingling in both hands.  She was unable to go to sleep due to this.  She does have a history of anxiety.  No cough, fevers.  Recently hospitalized with COVID-19 and hypoxia.  Received the antibody infusion.  Concerned that her thyroid levels may be off.  She did have her thyroid checked in the hospital which was within normal limits.  Was taking 100 mcg daily and they were giving her 125 in the hospital.  Unsure if this is the cause.      Past Medical History:  Diagnosis Date  . Thyroid disease     Patient Active Problem List   Diagnosis Date Noted  . Acute hypoxemic respiratory failure due to COVID-19 (HCC) 04/02/2020  . Hypothyroidism 05/21/2007    Past Surgical History:  Procedure Laterality Date  . LAPAROSCOPIC CHOLECYSTECTOMY  01/08/2019    OB History   No obstetric history on file.      Home Medications    Prior to Admission medications   Medication Sig Start Date End Date Taking? Authorizing Provider  acetaminophen (TYLENOL) 500 MG tablet Take 1,000 mg by mouth every 6 (six) hours as needed for mild pain.    [provider]  aspirin EC 81 MG tablet Take 81 mg by mouth daily as needed (for pain or headaches).  03/18/09   [provider]  benzonatate (TESSALON) 100 MG capsule Take 1 capsule (100 mg total) by mouth every 8 (eight) hours. 03/29/20    Avegno, Zachery Dakins, FNP  SYNTHROID 100 MCG tablet Take 100 mcg by mouth daily. 01/12/20   [provider]  VALERIAN ROOT PO Take 2 capsules by mouth 2 (two) times daily.    [provider]  losartan (COZAAR) 25 MG tablet Take 25 mg by mouth daily. 01/16/18 03/26/20  [provider]    Family History Family History  Problem Relation Age of Onset  . Cancer Mother   . Hypertension Maternal Grandmother   . Hypertension Paternal Grandfather     Social History Social History   Tobacco Use  . Smoking status: Never Smoker  . Smokeless tobacco: Never Used  Vaping Use  . Vaping Use: Never used  Substance Use Topics  . Alcohol use: No  . Drug use: Never     Allergies   Patient has no known allergies.   Review of Systems Review of Systems   Physical Exam Triage Vital Signs ED Triage Vitals  Enc Vitals Group     BP 04/23/20 0943 119/71     Pulse Rate 04/23/20 0943 81     Resp 04/23/20 0943 16     Temp 04/23/20 0943 97.9 F (36.6 C)     Temp Source 04/23/20 0943 Oral     SpO2 04/23/20 0943 97 %     Weight --      Height --  Head Circumference --      Peak Flow --      Pain Score 04/23/20 0942 2     Pain Loc --      Pain Edu? --      Excl. in GC? --    No data found.  Updated Vital Signs BP 119/71 (BP Location: Left Arm)   Pulse 81   Temp 97.9 F (36.6 C) (Oral)   Resp 16   LMP  (LMP Unknown)   SpO2 97%   Visual Acuity Right Eye Distance:   Left Eye Distance:   Bilateral Distance:    Right Eye Near:   Left Eye Near:    Bilateral Near:     Physical Exam Vitals and nursing note reviewed.  Constitutional:      General: She is not in acute distress.    Appearance: Normal appearance. She is not ill-appearing, toxic-appearing or diaphoretic.  HENT:     Head: Normocephalic.     Nose: Nose normal.  Eyes:     Conjunctiva/sclera: Conjunctivae normal.  Cardiovascular:     Rate and Rhythm: Normal rate and regular rhythm.    Pulmonary:     Effort: Pulmonary effort is normal.     Breath sounds: Normal breath sounds.  Musculoskeletal:        General: Normal range of motion.     Cervical back: Normal range of motion.  Skin:    General: Skin is warm and dry.     Findings: No rash.  Neurological:     General: No focal deficit present.     Mental Status: She is alert.  Psychiatric:        Mood and Affect: Mood normal.      UC Treatments / Results  Labs (all labs ordered are listed, but only abnormal results are displayed) Labs Reviewed  CBC WITH DIFFERENTIAL/PLATELET  COMPREHENSIVE METABOLIC PANEL  TSH    EKG   Radiology DG Chest 2 View  Result Date: 04/23/2020 CLINICAL DATA:  Chest pain and shortness of breath EXAM: CHEST - 2 VIEW COMPARISON:  04/02/2020 FINDINGS: Cardiac shadow is within normal limits. Previously seen left-sided infiltrate has nearly completely resolved in the interval from the prior study. Right lung is clear as well. No bony abnormality is seen. IMPRESSION: Near complete resolution of previously seen left-sided infiltrate. No acute abnormality noted. Electronically Signed   By: Alcide Clever M.D.   On: 04/23/2020 10:03    Procedures Procedures (including critical care time)  Medications Ordered in UC Medications - No data to display  Initial Impression / Assessment and Plan / UC Course  I have reviewed the triage vital signs and the nursing notes.  Pertinent labs & imaging results that were available during my care of the patient were reviewed by me and considered in my medical decision making (see chart for details).     Chest pain, shortness of breath and anxiety. VSS and she is non toxic or ill appearing X-ray today with improvement from previous EKG with normal sinus rhythm and normal rate. Lab work pending, recheck on thyroid levels Nothing concerning on exam.  Believe symptoms most likely due to anxiety or the melatonin or combination of both.  Recommended for  worsening symptoms go to the ER otherwise follow-up with her primary care as needed    Final Clinical Impressions(s) / UC Diagnoses   Final diagnoses:  Chest pain, unspecified type  SOB (shortness of breath)  Anxiety state     Discharge  Instructions     Your x-ray has improved from previous when you were hospitalized. Your EKG was completely normal We are drawing some basic lab work and I will call you with any abnormal results.  I believe that your symptoms may be related to the melatonin or anxiety Recommend follow-up with your primary care doctor about this.     ED Prescriptions    None     PDMP not reviewed this encounter.   Janace Aris, NP 04/23/20 1115

## 2020-04-23 NOTE — Discharge Instructions (Addendum)
Your x-ray has improved from previous when you were hospitalized. Your EKG was completely normal We are drawing some basic lab work and I will call you with any abnormal results.  I believe that your symptoms may be related to the melatonin or anxiety Recommend follow-up with your primary care doctor about this.

## 2020-04-23 NOTE — ED Triage Notes (Signed)
Pt presents with SOB and lower back pain, and weakness. Tested positive for COVID on 03/29/20.

## 2021-03-19 ENCOUNTER — Ambulatory Visit
Admission: EM | Admit: 2021-03-19 | Discharge: 2021-03-19 | Disposition: A | Discharge status: Home / Self Care | Payer: 59 | Attending: Emergency Medicine | Admitting: Emergency Medicine

## 2021-03-19 ENCOUNTER — Encounter: Payer: Self-pay | Admitting: Emergency Medicine

## 2021-03-19 DIAGNOSIS — L237 Allergic contact dermatitis due to plants, except food: Secondary | ICD-10-CM

## 2021-03-19 DIAGNOSIS — L299 Pruritus, unspecified: Secondary | ICD-10-CM

## 2021-03-19 MED ORDER — DEXAMETHASONE SODIUM PHOSPHATE 10 MG/ML IJ SOLN
10.0000 mg | Freq: Once | INTRAMUSCULAR | Status: AC
  Administered 2021-03-19: 10 mg via INTRAMUSCULAR

## 2021-03-19 MED ORDER — HYDROXYZINE HCL 25 MG PO TABS: 25.0000 mg | ORAL_TABLET | Freq: Four times a day (QID) | ORAL | 0 refills | Status: DC

## 2021-03-19 NOTE — ED Provider Notes (Signed)
[facility]   [id number] 03/19/21 Arrival Time: 0813  CC: Poison ivy rash  SUBJECTIVE:  Summer Roberts is a 60 y.o. female who presents with a rash diffuse about the body x 1 week.  Helping husband with yardwork and came into contact with poison ivy/ oak.  Localizes the rash to body.  Describes it as painful.  Has tried OTC medications without relief.  Symptoms are made worse with itching.  Denies similar symptoms in the past.   Denies fever, chills, nausea, vomiting, swelling, discharge.  ROS: As per HPI.  All other pertinent ROS negative.     Past Medical History:  Diagnosis Date   Thyroid disease    Past Surgical History:  Procedure Laterality Date   LAPAROSCOPIC CHOLECYSTECTOMY  01/08/2019   No Known Allergies No current facility-administered medications on file prior to encounter.   Current Outpatient Medications on File Prior to Encounter  Medication Sig Dispense Refill   acetaminophen (TYLENOL) 500 MG tablet Take 1,000 mg by mouth every 6 (six) hours as needed for mild pain.     aspirin EC 81 MG tablet Take 81 mg by mouth daily as needed (for pain or headaches).      benzonatate (TESSALON) 100 MG capsule Take 1 capsule (100 mg total) by mouth every 8 (eight) hours. 30 capsule 0   SYNTHROID 100 MCG tablet Take 100 mcg by mouth daily.     VALERIAN ROOT PO Take 2 capsules by mouth 2 (two) times daily.     [DISCONTINUED] losartan (COZAAR) 25 MG tablet Take 25 mg by mouth daily.  11   Social History   Socioeconomic History   Marital status: Married    Spouse name: Not on file   Number of children: Not on file   Years of education: Not on file   Highest education level: Not on file  Occupational History   Not on file  Tobacco Use   Smoking status: Never   Smokeless tobacco: Never  Vaping Use   Vaping Use: Never used  Substance and Sexual Activity   Alcohol use: No   Drug use: Never   Sexual activity: Not Currently    Birth control/protection: None  Other  Topics Concern   Not on file  Social History Narrative   Not on file   Social Determinants of Health   Financial Resource Strain: Not on file  Food Insecurity: Not on file  Transportation Needs: Not on file  Physical Activity: Not on file  Stress: Not on file  Social Connections: Not on file  Intimate Partner Violence: Not on file   Family History  Problem Relation Age of Onset   Cancer Mother    Hypertension Maternal Grandmother    Hypertension Paternal Grandfather     OBJECTIVE: Vitals:   03/19/21 0827  BP: 136/75  Pulse: 70  Resp: 18  Temp: 98 F (36.7 C)  TempSrc: Oral  SpO2: 98%    General appearance: alert; no distress Head: NCAT Lungs: normal respiratory effort Extremities: no edema Skin: warm and dry; areas of linear papules and vesicles with surrounding erythema diffuse about body including UEs, LEs, and torso Psychological: alert and cooperative; normal mood and affect  ASSESSMENT & PLAN:  1. Poison ivy dermatitis   2. Itching     Meds ordered this encounter  Medications   hydrOXYzine (ATARAX/VISTARIL) 25 MG tablet    Sig: Take 1 tablet (25 mg total) by mouth every 6 (six) hours.    Dispense:  12  tablet    Refill:  0    Order Specific Question:   Supervising Provider    Answer:   Eustace Moore [[id number]]   dexamethasone (DECADRON) injection 10 mg    @NFU @  Wash with warm water and mild soap Steroid shot given in office Use OTC zyrtec, allegra, or claritin during the day.  Benadryl at night. You may also use OTC hydrocortisone cream and/or calamine lotion to help alleviate itching Hydroxyzine for itching.  This medication may make you drowsy.  DO NOT TAKE prior to driving or operating heavy machinery Follow up with PCP if symptoms persists  Return or go to the ED if you have any new or worsening symptoms such as fever, chills, nausea, vomiting, difficulty breathing, throat swelling, tongue swelling, numbness/ tingling in mouth, worsening  symptoms despite treatment, etc...   Reviewed expectations re: course of current medical issues. Questions answered. Outlined signs and symptoms indicating need for more acute intervention. Patient verbalized understanding. After Visit Summary given.    [place], PA-C 03/19/21 [phone]

## 2021-03-19 NOTE — ED Triage Notes (Signed)
Poison oak rash all over body since last Friday

## 2021-03-19 NOTE — Discharge Instructions (Addendum)
Wash with warm water and mild soap Steroid shot given in office Use OTC zyrtec, allegra, or claritin during the day.  Benadryl at night. You may also use OTC hydrocortisone cream and/or calamine lotion to help alleviate itching Hydroxyzine for itching.  This medication may make you drowsy.  DO NOT TAKE prior to driving or operating heavy machinery Follow up with PCP if symptoms persists  Return or go to the ED if you have any new or worsening symptoms such as fever, chills, nausea, vomiting, difficulty breathing, throat swelling, tongue swelling, numbness/ tingling in mouth, worsening symptoms despite treatment, etc..Marland Kitchen

## 2021-03-22 ENCOUNTER — Emergency Department (HOSPITAL_COMMUNITY)
Admission: EM | Admit: 2021-03-22 | Discharge: 2021-03-22 | Disposition: A | Discharge status: Home / Self Care | Payer: 59 | Attending: Emergency Medicine | Admitting: Emergency Medicine

## 2021-03-22 DIAGNOSIS — E039 Hypothyroidism, unspecified: Secondary | ICD-10-CM | POA: Insufficient documentation

## 2021-03-22 DIAGNOSIS — Z8616 Personal history of COVID-19: Secondary | ICD-10-CM | POA: Diagnosis not present

## 2021-03-22 DIAGNOSIS — L237 Allergic contact dermatitis due to plants, except food: Secondary | ICD-10-CM | POA: Insufficient documentation

## 2021-03-22 DIAGNOSIS — Z79899 Other long term (current) drug therapy: Secondary | ICD-10-CM | POA: Insufficient documentation

## 2021-03-22 DIAGNOSIS — R21 Rash and other nonspecific skin eruption: Secondary | ICD-10-CM | POA: Diagnosis present

## 2021-03-22 MED ORDER — PREDNISONE 20 MG PO TABS: 40.0000 mg | ORAL_TABLET | Freq: Every day | ORAL | 0 refills | Status: AC

## 2021-03-22 MED ORDER — DIPHENHYDRAMINE HCL 25 MG PO CAPS: 50.0000 mg | ORAL_CAPSULE | Freq: Once | ORAL | Status: DC

## 2021-03-22 MED ORDER — PREDNISONE 20 MG PO TABS: 60.0000 mg | ORAL_TABLET | Freq: Once | ORAL | Status: DC

## 2021-03-22 MED ORDER — FAMOTIDINE IN NACL 20-0.9 MG/50ML-% IV SOLN
20.0000 mg | Freq: Once | INTRAVENOUS | Status: AC
  Administered 2021-03-22: 20 mg via INTRAVENOUS
  Filled 2021-03-22: qty 50

## 2021-03-22 MED ORDER — DIPHENHYDRAMINE HCL 50 MG/ML IJ SOLN
50.0000 mg | Freq: Once | INTRAMUSCULAR | Status: AC
  Administered 2021-03-22: 50 mg via INTRAVENOUS
  Filled 2021-03-22: qty 1

## 2021-03-22 MED ORDER — TRIAMCINOLONE ACETONIDE 0.1 % EX CREA: 1.0000 "application " | TOPICAL_CREAM | Freq: Two times a day (BID) | CUTANEOUS | 0 refills | Status: DC

## 2021-03-22 MED ORDER — FAMOTIDINE 20 MG PO TABS: 20.0000 mg | ORAL_TABLET | Freq: Once | ORAL | Status: DC

## 2021-03-22 MED ORDER — METHYLPREDNISOLONE SODIUM SUCC 125 MG IJ SOLR
125.0000 mg | Freq: Once | INTRAMUSCULAR | Status: AC
  Administered 2021-03-22: 125 mg via INTRAVENOUS
  Filled 2021-03-22: qty 2

## 2021-03-22 NOTE — ED Triage Notes (Signed)
Pt here with c/o of all over body rash since 7/29. Pt face and eyes swollen in triage. Pt was cleaning up yard and came in contact with poison oak.  Complains of heaviness in chest. Denies SOB. Airway intact.

## 2021-03-22 NOTE — ED Provider Notes (Signed)
[facility] EMERGENCY DEPARTMENT Provider Note   CSN: [id number] Arrival date & time: 03/22/21  7782     History Chief Complaint  Patient presents with   Rash    Summer Roberts is a 60 y.o. female with complaints of poison ivy/oak rash that occurred while working out in the yard on 07/29. She reports she helped her husband clean up the yard/a tree that fell down and since that time has begun having an itching rash all over. The rash started on her right forearm and has since spread. It is now spreading to her face. She states she has changed her sheets daily and washed her clothing/bedding. She went to an UC and was given a "shot" and a Rx for hydroxyzine without relief. No throat swelling, difficulty breathing, abdominal pain, nausea, vomiting, or any other associated symptoms.     The history is provided by the patient and medical records.      Past Medical History:  Diagnosis Date   Thyroid disease     Patient Active Problem List   Diagnosis Date Noted   Acute hypoxemic respiratory failure due to COVID-19 [facility]) 04/02/2020   Hypothyroidism 05/21/2007    Past Surgical History:  Procedure Laterality Date   LAPAROSCOPIC CHOLECYSTECTOMY  01/08/2019     OB History   No obstetric history on file.     Family History  Problem Relation Age of Onset   Cancer Mother    Hypertension Maternal Grandmother    Hypertension Paternal Grandfather     Social History   Tobacco Use   Smoking status: Never   Smokeless tobacco: Never  Vaping Use   Vaping Use: Never used  Substance Use Topics   Alcohol use: No   Drug use: Never    Home Medications Prior to Admission medications   Medication Sig Start Date End Date Taking? Authorizing Provider  predniSONE (DELTASONE) 20 MG tablet Take 2 tablets (40 mg total) by mouth daily for 5 days. 03/22/21 03/27/21 Yes Denina Rieger, PA-C  triamcinolone cream (KENALOG) 0.1 % Apply 1 application topically 2 (two) times daily.  03/22/21  Yes Hanan Mcwilliams, PA-C  acetaminophen (TYLENOL) 500 MG tablet Take 1,000 mg by mouth every 6 (six) hours as needed for mild pain.    [provider]  aspirin EC 81 MG tablet Take 81 mg by mouth daily as needed (for pain or headaches).  03/18/09   [provider]  benzonatate (TESSALON) 100 MG capsule Take 1 capsule (100 mg total) by mouth every 8 (eight) hours. 03/29/20   Avegno, Zachery Dakins, FNP  hydrOXYzine (ATARAX/VISTARIL) 25 MG tablet Take 1 tablet (25 mg total) by mouth every 6 (six) hours. 03/19/21   Wurst, [place], PA-C  SYNTHROID 100 MCG tablet Take 100 mcg by mouth daily. 01/12/20   [provider]  VALERIAN ROOT PO Take 2 capsules by mouth 2 (two) times daily.    [provider]  losartan (COZAAR) 25 MG tablet Take 25 mg by mouth daily. 01/16/18 03/26/20  [provider]    Allergies    Patient has no known allergies.  Review of Systems   Review of Systems  Constitutional:  Negative for chills and fever.  HENT:  Positive for facial swelling. Negative for trouble swallowing.   Respiratory:  Negative for shortness of breath.   Skin:  Positive for rash.  All other systems reviewed and are negative.  Physical Exam Updated Vital Signs BP (!) 168/81   Pulse 92  Temp 98.5 F (36.9 C) (Oral)   Resp 16   LMP  (LMP Unknown)   SpO2 100%   Physical Exam Vitals and nursing note reviewed.  Constitutional:      Appearance: She is not ill-appearing.  HENT:     Head: Atraumatic.     Comments: Periorbital edema bilaterally    Mouth/Throat:     Comments: Uvula is midline. Airway intact.  Eyes:     Extraocular Movements: Extraocular movements intact.     Conjunctiva/sclera: Conjunctivae normal.     Pupils: Pupils are equal, round, and reactive to light.  Cardiovascular:     Rate and Rhythm: Normal rate and regular rhythm.  Pulmonary:     Effort: Pulmonary effort is normal.     Breath sounds: Normal breath sounds. No wheezing,  rhonchi or rales.     Comments: Speaking in full sentences without difficulty. LCTAB.  Skin:    General: Skin is warm and dry.     Coloration: Skin is not jaundiced.     Findings: Rash present.     Comments: Vesicular linear type rash to BUEs, BLEs, and torso consistent with poison ivy/oak  Neurological:     Mental Status: She is alert.    ED Results / Procedures / Treatments   Labs (all labs ordered are listed, but only abnormal results are displayed) Labs Reviewed - No data to display  EKG None  Radiology No results found.  Procedures Procedures   Medications Ordered in ED Medications  methylPREDNISolone sodium succinate (SOLU-MEDROL) 125 mg/2 mL injection 125 mg (125 mg Intravenous Given 03/22/21 1358)  diphenhydrAMINE (BENADRYL) injection 50 mg (50 mg Intravenous Given 03/22/21 1400)  famotidine (PEPCID) IVPB 20 mg premix (20 mg Intravenous New Bag/Given 03/22/21 1404)    ED Course  I have reviewed the triage vital signs and the nursing notes.  Pertinent labs & imaging results that were available during my care of the patient were reviewed by me and considered in my medical decision making (see chart for details).    MDM Rules/Calculators/A&P                           60 year old female presents to the ED today with complaint of recurrent rash diffusely secondary to poison ivy/oak that she came into contact with on 07/29.  Is now having itching/swelling around her eyes as well.  On arrival to the ED vitals are stable patient appears to be in no acute distress.  Her airway is intact.  Able speak in full sentences without difficulty.  No signs of anaphylaxis at this time however patient is having contact dermatitis secondary to poison ivy/oak.  Given extent of involvement we will plan for IV Solu-Medrol, Benadryl, Pepcid.  Will likely benefit from oral glucocorticoids as well as corticosteroid ointment with PCP follow-up.  On reevaluation pt resting comfortably. Reports  improvement in itching. Will discharge home at this time with PCP follow up. Pt in agreement with plan and stable for discharge.   This note was prepared using Dragon voice recognition software and may include unintentional dictation errors due to the inherent limitations of voice recognition software.   Final Clinical Impression(s) / ED Diagnoses Final diagnoses:  Contact dermatitis due to poison oak    Rx / DC Orders ED Discharge Orders          Ordered    predniSONE (DELTASONE) 20 MG tablet  Daily  03/22/21 1507    triamcinolone cream (KENALOG) 0.1 %  2 times daily        03/22/21 1507             Discharge Instructions      Please pick up prescriptions and use as prescribed Attached is additional information on poison oak. Continue using Benadryl daily for itching.   Follow up with your PCP regarding ED visit  Return to the ED IMMEDIATELY for any new/worsening symptoms       Tanda Rockers, PA-C 03/22/21 1509    Gloris Manchester, MD 03/22/21 1958

## 2021-03-22 NOTE — ED Provider Notes (Signed)
Emergency Medicine Provider Triage Evaluation Note  Summer Roberts , a 60 y.o. female  was evaluated in triage.  Pt complains of poison ivy rash that occurred while working out in the yard on 07/29. She reports she helped her husband clean up the yard/a tree that fell down and since that time has begun having an itching rash all over. The rash started on her right forearm and has since spread. It is now spreading to her face. She states she has changed her sheets daily and washed her clothing/bedding. She went to an UC and was given a "shot" and a Rx for hydroxyzine without relief. No throat swelling, difficulty breathing.  Review of Systems  Positive: + rash Negative: - throat swelling, difficulty breathing  Physical Exam  BP (!) 168/81   Pulse 92   Temp 98.5 F (36.9 C) (Oral)   Resp 16   LMP  (LMP Unknown)   SpO2 100%  Gen:   Awake, no distress   Resp:  Normal effort. LCTAB without wheezing. MSK:   Moves extremities without difficulty  Other:  Airway intact. No posterior oropharyngeal erythema or edema. Diffuse vesicular rash to arms and legs with periorbital edema/erythema.  Medical Decision Making  Medically screening exam initiated at 9:30 AM.  Appropriate orders placed.  Summer Roberts was informed that the remainder of the evaluation will be completed by another provider, this initial triage assessment does not replace that evaluation, and the importance of remaining in the ED until their evaluation is complete.  Contact dermatitis s/2 poison ivy.    Tanda Rockers, PA-C 03/22/21 0932    Gloris Manchester, MD 03/22/21 [phone]

## 2021-03-22 NOTE — Discharge Instructions (Addendum)
Please pick up prescriptions and use as prescribed Attached is additional information on poison oak. Continue using Benadryl daily for itching.   Follow up with your PCP regarding ED visit  Return to the ED IMMEDIATELY for any new/worsening symptoms

## 2021-05-24 ENCOUNTER — Emergency Department (HOSPITAL_COMMUNITY): Payer: 59

## 2021-05-24 ENCOUNTER — Encounter (HOSPITAL_COMMUNITY): Payer: Self-pay | Admitting: *Deleted

## 2021-05-24 ENCOUNTER — Emergency Department (HOSPITAL_COMMUNITY)
Admission: EM | Admit: 2021-05-24 | Discharge: 2021-05-24 | Disposition: A | Discharge status: Home / Self Care | Payer: 59 | Attending: Emergency Medicine | Admitting: Emergency Medicine

## 2021-05-24 ENCOUNTER — Other Ambulatory Visit: Payer: Self-pay

## 2021-05-24 DIAGNOSIS — Z8616 Personal history of COVID-19: Secondary | ICD-10-CM | POA: Diagnosis not present

## 2021-05-24 DIAGNOSIS — R0789 Other chest pain: Secondary | ICD-10-CM | POA: Insufficient documentation

## 2021-05-24 DIAGNOSIS — Z79899 Other long term (current) drug therapy: Secondary | ICD-10-CM | POA: Diagnosis not present

## 2021-05-24 DIAGNOSIS — R0602 Shortness of breath: Secondary | ICD-10-CM | POA: Insufficient documentation

## 2021-05-24 DIAGNOSIS — R112 Nausea with vomiting, unspecified: Secondary | ICD-10-CM | POA: Diagnosis present

## 2021-05-24 DIAGNOSIS — Z20822 Contact with and (suspected) exposure to covid-19: Secondary | ICD-10-CM | POA: Insufficient documentation

## 2021-05-24 DIAGNOSIS — Z7982 Long term (current) use of aspirin: Secondary | ICD-10-CM | POA: Diagnosis not present

## 2021-05-24 DIAGNOSIS — E039 Hypothyroidism, unspecified: Secondary | ICD-10-CM | POA: Diagnosis not present

## 2021-05-24 DIAGNOSIS — R197 Diarrhea, unspecified: Secondary | ICD-10-CM | POA: Diagnosis not present

## 2021-05-24 DIAGNOSIS — R079 Chest pain, unspecified: Secondary | ICD-10-CM

## 2021-05-24 DIAGNOSIS — R0600 Dyspnea, unspecified: Secondary | ICD-10-CM

## 2021-05-24 LAB — COMPREHENSIVE METABOLIC PANEL
ALT: 12 U/L (ref 0–44)
AST: 18 U/L (ref 15–41)
Albumin: 4.2 g/dL (ref 3.5–5.0)
Alkaline Phosphatase: 63 U/L (ref 38–126)
Anion gap: 8 (ref 5–15)
BUN: 13 mg/dL (ref 6–20)
CO2: 26 mmol/L (ref 22–32)
Calcium: 9.8 mg/dL (ref 8.9–10.3)
Chloride: 106 mmol/L (ref 98–111)
Creatinine, Ser: 0.63 mg/dL (ref 0.44–1.00)
GFR, Estimated: >60 mL/min (ref >60)
Glucose, Bld: 113 mg/dL — ABNORMAL HIGH (ref 70–99)
Potassium: 4.2 mmol/L (ref 3.5–5.1)
Sodium: 140 mmol/L (ref 135–145)
Total Bilirubin: 0.6 mg/dL (ref 0.3–1.2)
Total Protein: 7.3 g/dL (ref 6.5–8.1)

## 2021-05-24 LAB — CBC WITH DIFFERENTIAL/PLATELET
Abs Immature Granulocytes: 0.02 10*3/uL (ref 0.00–0.07)
Basophils Absolute: 0 10*3/uL (ref 0.0–0.1)
Basophils Relative: 0 %
Eosinophils Absolute: 0.1 10*3/uL (ref 0.0–0.5)
Eosinophils Relative: 1 %
HCT: 42.6 % (ref 36.0–46.0)
Hemoglobin: 14.6 g/dL (ref 12.0–15.0)
Immature Granulocytes: 0 %
Lymphocytes Relative: 34 %
Lymphs Abs: 1.8 10*3/uL (ref 0.7–4.0)
MCH: 31.1 pg (ref 26.0–34.0)
MCHC: 34.3 g/dL (ref 30.0–36.0)
MCV: 90.8 fL (ref 80.0–100.0)
Monocytes Absolute: 0.5 10*3/uL (ref 0.1–1.0)
Monocytes Relative: 9 %
Neutro Abs: 2.9 10*3/uL (ref 1.7–7.7)
Neutrophils Relative %: 56 %
Platelets: 209 10*3/uL (ref 150–400)
RBC: 4.69 MIL/uL (ref 3.87–5.11)
RDW: 13 % (ref 11.5–15.5)
WBC: 5.3 10*3/uL (ref 4.0–10.5)
nRBC: 0 % (ref 0.0–0.2)

## 2021-05-24 LAB — BRAIN NATRIURETIC PEPTIDE: B Natriuretic Peptide: 34.7 pg/mL (ref 0.0–100.0)

## 2021-05-24 LAB — RESP PANEL BY RT-PCR (FLU A&B, COVID) ARPGX2
Influenza A by PCR: NEGATIVE
Influenza B by PCR: NEGATIVE
SARS Coronavirus 2 by RT PCR: NEGATIVE

## 2021-05-24 LAB — TROPONIN I (HIGH SENSITIVITY)
Troponin I (High Sensitivity): <2 ng/L (ref <18)
Troponin I (High Sensitivity): 3 ng/L (ref <18)

## 2021-05-24 MED ORDER — IOHEXOL 350 MG/ML SOLN
100.0000 mL | Freq: Once | INTRAVENOUS | Status: AC | PRN
  Administered 2021-05-24: 100 mL via INTRAVENOUS

## 2021-05-24 NOTE — ED Notes (Signed)
Pt stated she did have some ringing last week, but it has stopped.

## 2021-05-24 NOTE — Discharge Instructions (Addendum)
Follow-up with your doctor.  He can follow-up with cardiology also for these episodes of chest pain that you have been having

## 2021-05-24 NOTE — ED Provider Notes (Signed)
Emergency Medicine Provider Triage Evaluation Note  Summer Roberts , a 60 y.o. female  was evaluated in triage.  Pt complains of exertional SOB has been ongoing for approximately 1 week.  She states she has some nausea yesterday but no vomiting.  Also states that she had some diarrhea yesterday.  No blood in stool.  No fevers.  Endorses some mild chest pain that seems to radiate to her left shoulder currently.  Review of Systems  Positive: Chest pain, SOB, nausea, diarrhea Negative: Fever  Physical Exam  BP (!) 166/76 (BP Location: Left Arm)   Pulse 98   Temp 98.2 F (36.8 C)   Resp 18   LMP  (LMP Unknown)   SpO2 100%  Gen:   Awake, no distress   Resp:  Normal effort  MSK:   Moves extremities without difficulty  Other:  Abdomen soft nontender.  No guarding or rebound.  Lungs clear to auscultation all fields.  In no acute distress.  Medical Decision Making  Medically screening exam initiated at 12:29 PM.  Appropriate orders placed.  Brett Fairy was informed that the remainder of the evaluation will be completed by another provider, this initial triage assessment does not replace that evaluation, and the importance of remaining in the ED until their evaluation is complete.  60 year old female states she has no medical issues. Here today with some exertional shortness of breath but also endorses shortness of breath with lying down.  Will obtain cardiac work-up, chest x-ray, also BNP.   Gailen Shelter, [place] 05/24/21 1235    Melene Plan, DO 05/25/21 [phone]

## 2021-05-24 NOTE — ED Triage Notes (Signed)
Pt reports ongoing sob that has been getting more severe with mild exertion. Has mild chest discomfort with it. Denies cough, fever, n/v. No acute resp distress is noted at this time.

## 2021-05-24 NOTE — ED Provider Notes (Signed)
[facility] EMERGENCY DEPARTMENT Provider Note   CSN: [id number] Arrival date & time: 05/24/21  1207     History Chief Complaint  Patient presents with   Shortness of Breath    Summer Roberts is a 60 y.o. female.   Shortness of Breath Associated symptoms: no abdominal pain and no rash   Patient presents with chest pain and shortness of breath.  Has been going over the last week.  States sometimes worse with exertion.  Sometimes will get pain in her anterior chest.  Sometimes goes to the shoulder.  Sometimes does not get the pain.  States she has been having episodes like this since she had COVID about a year ago.  Also has had some nausea and vomiting diarrhea or recently.  Does not always get chest pain with exertion.  Sometimes will get pain without exertion sometimes will come with exertion.  No fevers or chills.  No coughing.  No swelling in her legs.  States she gets more shortness of breath then the pain.  States she does have a family history of early cardiac disease.  Patient states she has not had a cardiac work-up herself.    Past Medical History:  Diagnosis Date   Thyroid disease     Patient Active Problem List   Diagnosis Date Noted   Acute hypoxemic respiratory failure due to COVID-19 [facility]) 04/02/2020   Hypothyroidism 05/21/2007    Past Surgical History:  Procedure Laterality Date   LAPAROSCOPIC CHOLECYSTECTOMY  01/08/2019     OB History   No obstetric history on file.     Family History  Problem Relation Age of Onset   Cancer Mother    Hypertension Maternal Grandmother    Hypertension Paternal Grandfather     Social History   Tobacco Use   Smoking status: Never   Smokeless tobacco: Never  Vaping Use   Vaping Use: Never used  Substance Use Topics   Alcohol use: No   Drug use: Never    Home Medications Prior to Admission medications   Medication Sig Start Date End Date Taking? Authorizing Provider  acetaminophen (TYLENOL)  500 MG tablet Take 1,000 mg by mouth every 6 (six) hours as needed for mild pain.    [provider]  aspirin EC 81 MG tablet Take 81 mg by mouth daily as needed (for pain or headaches).  03/18/09   [provider]  benzonatate (TESSALON) 100 MG capsule Take 1 capsule (100 mg total) by mouth every 8 (eight) hours. 03/29/20   Avegno, Zachery Dakins, FNP  hydrOXYzine (ATARAX/VISTARIL) 25 MG tablet Take 1 tablet (25 mg total) by mouth every 6 (six) hours. 03/19/21   Wurst, [place], PA-C  SYNTHROID 100 MCG tablet Take 100 mcg by mouth daily. 01/12/20   [provider]  triamcinolone cream (KENALOG) 0.1 % Apply 1 application topically 2 (two) times daily. 03/22/21   Venter, Margaux, PA-C  VALERIAN ROOT PO Take 2 capsules by mouth 2 (two) times daily.    [provider]  losartan (COZAAR) 25 MG tablet Take 25 mg by mouth daily. 01/16/18 03/26/20  [provider]    Allergies    Patient has no known allergies.  Review of Systems   Review of Systems  Constitutional:  Negative for appetite change.  HENT:  Negative for congestion.   Respiratory:  Positive for shortness of breath.   Gastrointestinal:  Negative for abdominal pain.  Genitourinary:  Negative for flank pain.  Skin:  Negative  for rash.  Neurological:  Negative for weakness.  Psychiatric/Behavioral:  Negative for confusion.    Physical Exam Updated Vital Signs BP 129/80   Pulse 66   Temp 98.3 F (36.8 C) (Oral)   Resp 15   LMP  (LMP Unknown)   SpO2 97%   Physical Exam Vitals and nursing note reviewed.  HENT:     Head: Normocephalic and atraumatic.  Cardiovascular:     Rate and Rhythm: Normal rate and regular rhythm.  Pulmonary:     Breath sounds: Normal breath sounds. No wheezing, rhonchi or rales.  Chest:     Chest wall: Tenderness present.  Abdominal:     Tenderness: There is no abdominal tenderness.  Musculoskeletal:     Right lower leg: No edema.     Left lower leg: No edema.   Skin:    General: Skin is warm.     Capillary Refill: Capillary refill takes less than 2 seconds.  Neurological:     Mental Status: She is alert and oriented to person, place, and time.    ED Results / Procedures / Treatments   Labs (all labs ordered are listed, but only abnormal results are displayed) Labs Reviewed  COMPREHENSIVE METABOLIC PANEL - Abnormal; Notable for the following components:      Result Value   Glucose, Bld 113 (*)    All other components within normal limits  RESP PANEL BY RT-PCR (FLU A&B, COVID) ARPGX2  CBC WITH DIFFERENTIAL/PLATELET  BRAIN NATRIURETIC PEPTIDE  TROPONIN I (HIGH SENSITIVITY)  TROPONIN I (HIGH SENSITIVITY)    EKG EKG Interpretation  Date/Time:  Monday May 24 2021 18:05:50 EDT Ventricular Rate:  72 PR Interval:  150 QRS Duration: 102 QT Interval:  432 QTC Calculation: 473 R Axis:   -30 Text Interpretation: Sinus rhythm Left axis deviation Confirmed by Benjiman Core [id number]) on 05/24/2021 8:22:37 PM  Radiology DG Chest 2 View  Result Date: 05/24/2021 CLINICAL DATA:  Shortness of breath. EXAM: CHEST - 2 VIEW COMPARISON:  04/23/2020 FINDINGS: Patchy densities in the periphery of the left lung have resolved since 2021. No airspace disease or consolidation in lungs. No pulmonary edema. Heart and mediastinum are within normal limits. Trachea is midline. No pleural effusions. No acute bone abnormality. IMPRESSION: No active cardiopulmonary disease. Electronically Signed   By: Richarda Overlie M.D.   On: 05/24/2021 13:24   CT Angio Chest PE W and/or Wo Contrast  Result Date: 05/24/2021 CLINICAL DATA:  Shortness of breath, PE suspected EXAM: CT ANGIOGRAPHY CHEST WITH CONTRAST TECHNIQUE: Multidetector CT imaging of the chest was performed using the standard protocol during bolus administration of intravenous contrast. Multiplanar CT image reconstructions and MIPs were obtained to evaluate the vascular anatomy. CONTRAST:  OMNIPAQUE IOHEXOL  350 MG/ML SOLN COMPARISON:  04/02/2020 FINDINGS: Cardiovascular: Satisfactory opacification of the pulmonary arteries to the segmental level. No evidence of pulmonary embolism. Normal heart size. No pericardial effusion. Mediastinum/Nodes: No enlarged mediastinal, hilar, or axillary lymph nodes. Thyroid gland, trachea, and esophagus demonstrate no significant findings. Lungs/Pleura: Minimal dependent bibasilar scarring and or partial atelectasis. No pleural effusion or pneumothorax. Upper Abdomen: No acute abnormality. Musculoskeletal: No chest wall abnormality. No acute or significant osseous findings. Review of the MIP images confirms the above findings. IMPRESSION: Negative examination for pulmonary embolism. Electronically Signed   By: Jearld Lesch M.D.   On: 05/24/2021 20:04    Procedures Procedures   Medications Ordered in ED Medications  iohexol (OMNIPAQUE) 350 MG/ML injection 100 mL (100 mLs Intravenous  Contrast Given 05/24/21 2000)    ED Course  I have reviewed the triage vital signs and the nursing notes.  Pertinent labs & imaging results that were available during my care of the patient were reviewed by me and considered in my medical decision making (see chart for details).    MDM Rules/Calculators/A&P                           Patient presents with nausea vomiting diarrhea some chest pain.  Some shortness of breath.  Has been going on for last week.  Sometimes exertional sometimes not.  Sometimes goes to the left arm.  No previous cardiac history.  Sounds like she does have a early cardiac history in the family.  States she has had some episodes like this since she had COVID about a year ago.  EKG reassuring.  First EKG had artifact difficult to tell rhythm but appeared sinus.  Second EKG clearly is a sinus rhythm.  Troponin negative x2.  Although the timing of it shows that they were done under 2 hours apart they were actually drawn about 6 hours apart.  Heart pathway score of 5.   CT angiography done due to the dyspnea after COVID.  Has had positive D-dimers in the past.  CTA done a year ago showed a possible lung nodule.  Not apparent on today's CT.  No PE.  Will need outpatient follow-up.  Can follow with PCP and probably cardiology since does have a family history and relatively high heart score.  However appears to be able to discharge at this time Final Clinical Impression(s) / ED Diagnoses Final diagnoses:  Dyspnea, unspecified type  Nonspecific chest pain    Rx / DC Orders ED Discharge Orders     None        Benjiman Core, MD 05/24/21 2110

## 2021-11-10 IMAGING — DX DG CHEST 2V
2 series · 2 of 2 positions shown · non-contrast
Comparison: Chest radiograph 03/26/2020

CLINICAL DATA: Patient with worsening shortness of breath.  Cough.

EXAM:
CHEST - 2 VIEW

[chest pa]
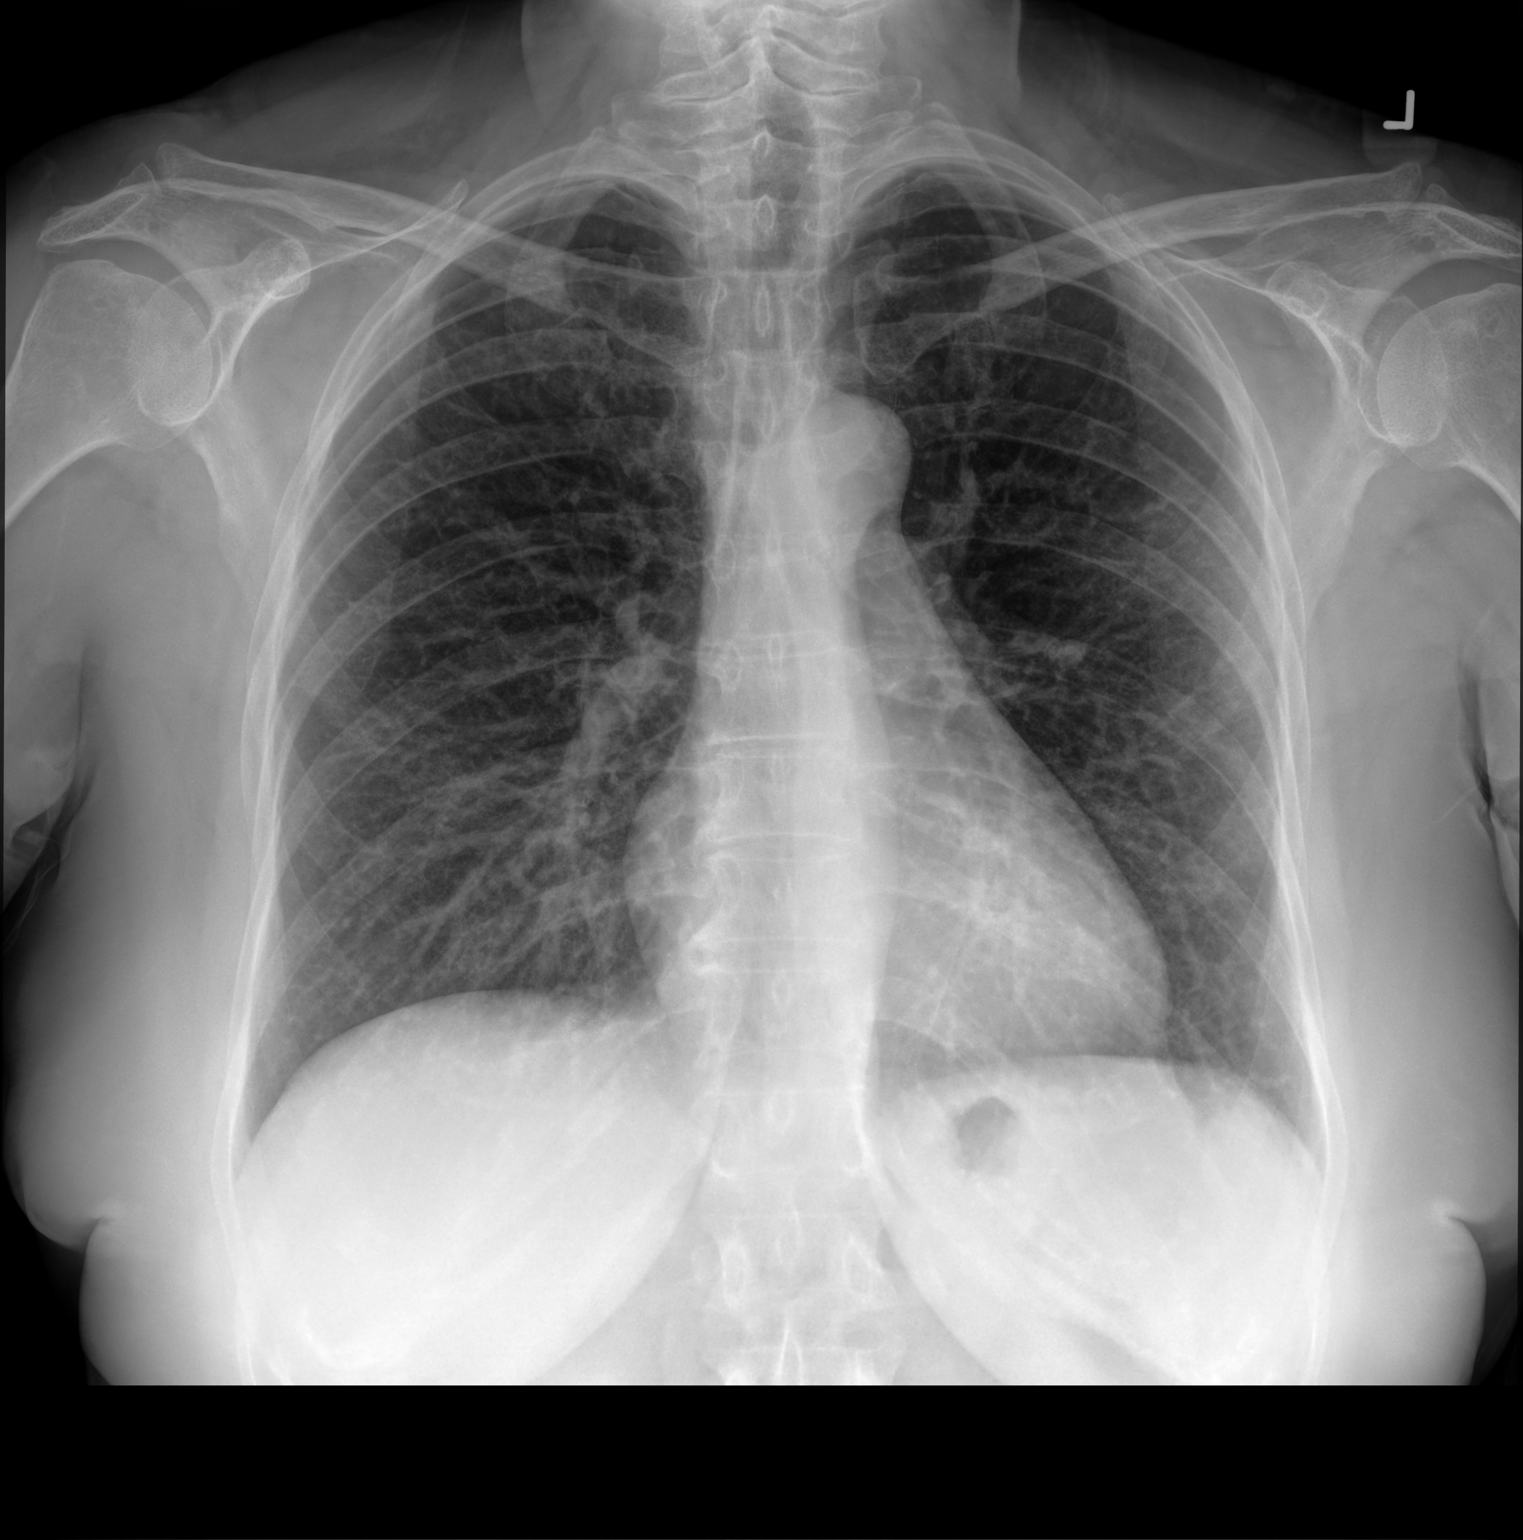

[chest lat]
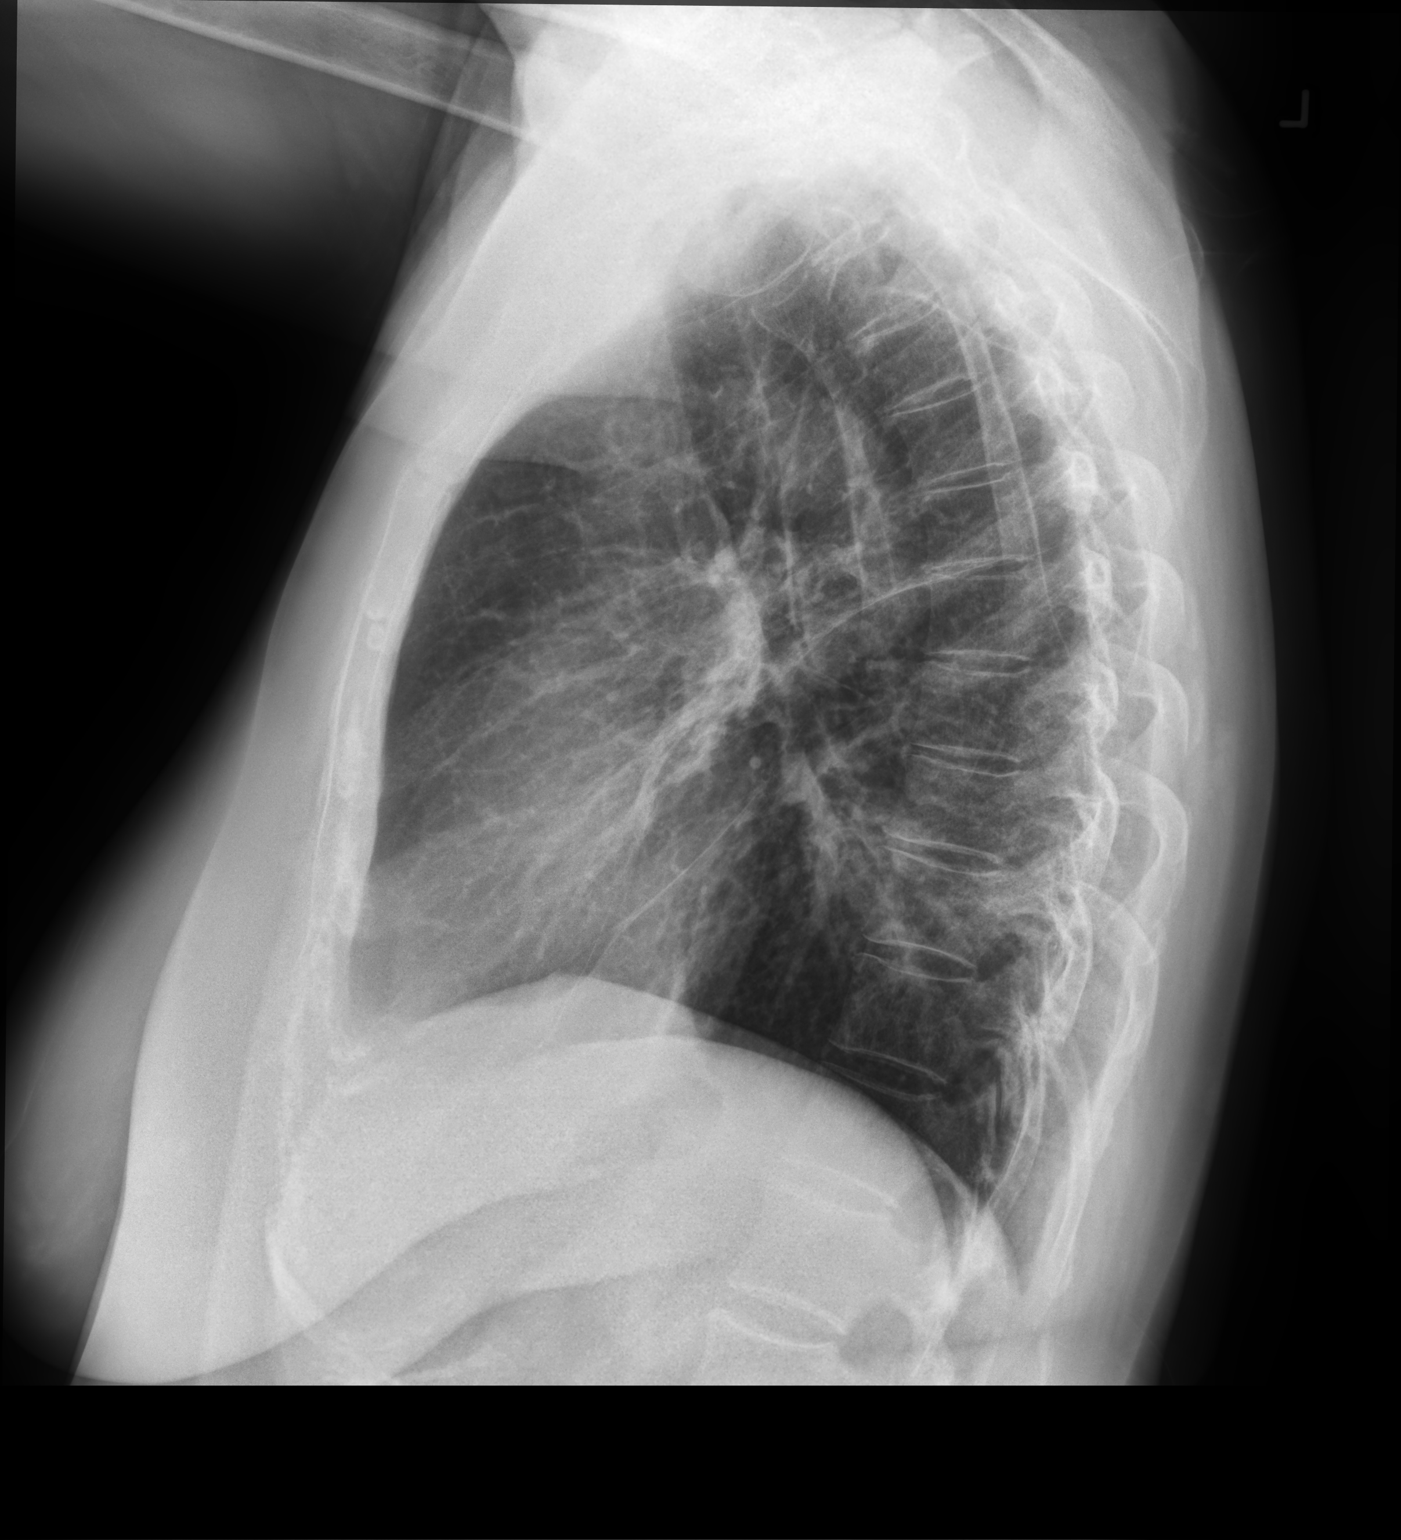

[2 of 2 positions shown; findings below may reference images not displayed]

FINDINGS: Interval development of retrocardiac consolidation. Stable cardiac
and mediastinal contours. No pleural effusion or pneumothorax.
Unremarkable osseous structures.
IMPRESSION: New retrocardiac consolidation concerning for pneumonia. Followup PA
and lateral chest X-ray is recommended in 3-4 weeks following trial
of antibiotic therapy to ensure resolution and exclude underlying
malignancy.

## 2021-12-03 ENCOUNTER — Other Ambulatory Visit: Payer: Self-pay

## 2021-12-03 ENCOUNTER — Encounter (HOSPITAL_BASED_OUTPATIENT_CLINIC_OR_DEPARTMENT_OTHER): Payer: Self-pay | Admitting: Emergency Medicine

## 2021-12-03 ENCOUNTER — Emergency Department (HOSPITAL_BASED_OUTPATIENT_CLINIC_OR_DEPARTMENT_OTHER)
Admission: EM | Admit: 2021-12-03 | Discharge: 2021-12-03 | Disposition: A | Discharge status: Home / Self Care | Payer: Commercial Managed Care - HMO | Attending: Emergency Medicine | Admitting: Emergency Medicine

## 2021-12-03 DIAGNOSIS — S161XXA Strain of muscle, fascia and tendon at neck level, initial encounter: Secondary | ICD-10-CM | POA: Insufficient documentation

## 2021-12-03 DIAGNOSIS — M545 Low back pain, unspecified: Secondary | ICD-10-CM | POA: Insufficient documentation

## 2021-12-03 DIAGNOSIS — Z7982 Long term (current) use of aspirin: Secondary | ICD-10-CM | POA: Insufficient documentation

## 2021-12-03 DIAGNOSIS — R42 Dizziness and giddiness: Secondary | ICD-10-CM | POA: Insufficient documentation

## 2021-12-03 DIAGNOSIS — Y92410 Unspecified street and highway as the place of occurrence of the external cause: Secondary | ICD-10-CM | POA: Insufficient documentation

## 2021-12-03 DIAGNOSIS — Z79899 Other long term (current) drug therapy: Secondary | ICD-10-CM | POA: Insufficient documentation

## 2021-12-03 DIAGNOSIS — S199XXA Unspecified injury of neck, initial encounter: Secondary | ICD-10-CM | POA: Diagnosis present

## 2021-12-03 DIAGNOSIS — R519 Headache, unspecified: Secondary | ICD-10-CM | POA: Diagnosis not present

## 2021-12-03 DIAGNOSIS — R110 Nausea: Secondary | ICD-10-CM | POA: Insufficient documentation

## 2021-12-03 MED ORDER — METHOCARBAMOL 500 MG PO TABS: 500.0000 mg | ORAL_TABLET | Freq: Four times a day (QID) | ORAL | 0 refills | Status: DC
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
## 2021-12-03 NOTE — ED Notes (Signed)
Discharge instructions, follow up care, and prescriptions reviewed and explained, pt verbalized understanding. Pt caox4 and ambulatory on departure.  
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
## 2021-12-23 ENCOUNTER — Other Ambulatory Visit: Payer: Self-pay

## 2021-12-23 ENCOUNTER — Encounter (HOSPITAL_BASED_OUTPATIENT_CLINIC_OR_DEPARTMENT_OTHER): Payer: Self-pay

## 2021-12-23 ENCOUNTER — Emergency Department (HOSPITAL_BASED_OUTPATIENT_CLINIC_OR_DEPARTMENT_OTHER)
Admission: EM | Admit: 2021-12-23 | Discharge: 2021-12-23 | Disposition: A | Discharge status: Home / Self Care | Payer: Commercial Managed Care - HMO | Attending: Emergency Medicine | Admitting: Emergency Medicine

## 2021-12-23 ENCOUNTER — Emergency Department (HOSPITAL_BASED_OUTPATIENT_CLINIC_OR_DEPARTMENT_OTHER): Payer: Commercial Managed Care - HMO

## 2021-12-23 DIAGNOSIS — S060X0D Concussion without loss of consciousness, subsequent encounter: Secondary | ICD-10-CM | POA: Insufficient documentation

## 2021-12-23 DIAGNOSIS — S0990XD Unspecified injury of head, subsequent encounter: Secondary | ICD-10-CM | POA: Diagnosis present
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
## 2021-12-23 NOTE — Discharge Instructions (Signed)
Your CT head was negative for bleeding or fractures of the skull.  It seems you continue to have lingering concussion symptoms that should have resolved by now.  I given you a follow-up appointment with a sports medicine doctor who handles concussions frequently.  Continue taking Tylenol and Motrin. ?
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
## 2022-01-26 ENCOUNTER — Ambulatory Visit (INDEPENDENT_AMBULATORY_CARE_PROVIDER_SITE_OTHER): Payer: Commercial Managed Care - HMO | Admitting: Sports Medicine

## 2022-01-26 VITALS — BP 124/80 | HR 85 | Ht 64.0 in | Wt 185.0 lb

## 2022-01-26 DIAGNOSIS — R270 Ataxia, unspecified: Secondary | ICD-10-CM | POA: Diagnosis not present

## 2022-01-26 DIAGNOSIS — S060X0A Concussion without loss of consciousness, initial encounter: Secondary | ICD-10-CM | POA: Diagnosis not present

## 2022-01-26 DIAGNOSIS — G44319 Acute post-traumatic headache, not intractable: Secondary | ICD-10-CM | POA: Diagnosis not present

## 2022-01-26 NOTE — Patient Instructions (Addendum)
Good to see you  Complete mental and physical rest for 48 hours after concussive event Recommend light aerobic activity while keeping symptoms less than 3/10 Stop mental or physical activities that cause symptoms to worsen greater than 3/10, and wait 24 hours before attempting them again Eliminate screen time as much as possible for first 48 hours after concussive event, then continue limited screen time (recommend less than 2 hours per day) Vestibular therapy referral No work for 1 week 1 week follow up
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
## 2022-02-02 ENCOUNTER — Ambulatory Visit (INDEPENDENT_AMBULATORY_CARE_PROVIDER_SITE_OTHER): Payer: Commercial Managed Care - HMO | Admitting: Sports Medicine

## 2022-02-02 VITALS — BP 124/81 | HR 85 | Ht 64.0 in | Wt 185.0 lb

## 2022-02-02 DIAGNOSIS — R270 Ataxia, unspecified: Secondary | ICD-10-CM

## 2022-02-02 DIAGNOSIS — G44319 Acute post-traumatic headache, not intractable: Secondary | ICD-10-CM | POA: Diagnosis not present

## 2022-02-02 DIAGNOSIS — F5101 Primary insomnia: Secondary | ICD-10-CM | POA: Diagnosis not present

## 2022-02-02 DIAGNOSIS — S060X0A Concussion without loss of consciousness, initial encounter: Secondary | ICD-10-CM

## 2022-02-02 NOTE — Patient Instructions (Addendum)
Good to see you  Recommend out of work for 2 weeks Recommend melatonin 5 mg at night for a goal of 7-8 hours of sleep Start vestibular therapy 2 week follow up
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
## 2022-02-18 ENCOUNTER — Ambulatory Visit (INDEPENDENT_AMBULATORY_CARE_PROVIDER_SITE_OTHER): Payer: Commercial Managed Care - HMO | Admitting: Sports Medicine

## 2022-02-18 VITALS — BP 108/78 | HR 69 | Ht 64.0 in | Wt 190.0 lb

## 2022-02-18 DIAGNOSIS — S060X0A Concussion without loss of consciousness, initial encounter: Secondary | ICD-10-CM | POA: Diagnosis not present

## 2022-02-18 DIAGNOSIS — G44319 Acute post-traumatic headache, not intractable: Secondary | ICD-10-CM

## 2022-02-18 DIAGNOSIS — R270 Ataxia, unspecified: Secondary | ICD-10-CM | POA: Diagnosis not present

## 2022-02-18 DIAGNOSIS — H532 Diplopia: Secondary | ICD-10-CM

## 2022-02-18 NOTE — Patient Instructions (Addendum)
Good to see you Brain MRI PT referral  2 week follow up

## 2022-02-25 ENCOUNTER — Telehealth: Payer: Self-pay | Admitting: Sports Medicine

## 2022-02-25 NOTE — Telephone Encounter (Signed)
Pt called about MRI. Insurance does not participate with [facility] nor [facility] ( confirmed this with [facility] ).  Per pt, please send order to [facility] as this info was given to her by her insurance.

## 2022-02-28 ENCOUNTER — Other Ambulatory Visit: Payer: Self-pay | Admitting: Sports Medicine

## 2022-02-28 DIAGNOSIS — H532 Diplopia: Secondary | ICD-10-CM

## 2022-02-28 DIAGNOSIS — G44319 Acute post-traumatic headache, not intractable: Secondary | ICD-10-CM

## 2022-02-28 DIAGNOSIS — R270 Ataxia, unspecified: Secondary | ICD-10-CM

## 2022-02-28 DIAGNOSIS — S060X0A Concussion without loss of consciousness, initial encounter: Secondary | ICD-10-CM
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
## 2022-03-04 ENCOUNTER — Ambulatory Visit (INDEPENDENT_AMBULATORY_CARE_PROVIDER_SITE_OTHER): Payer: Commercial Managed Care - HMO | Admitting: Sports Medicine

## 2022-03-04 VITALS — BP 122/80 | HR 73 | Ht 64.0 in | Wt 190.0 lb

## 2022-03-04 DIAGNOSIS — R270 Ataxia, unspecified: Secondary | ICD-10-CM | POA: Diagnosis not present

## 2022-03-04 DIAGNOSIS — G44319 Acute post-traumatic headache, not intractable: Secondary | ICD-10-CM

## 2022-03-04 DIAGNOSIS — M542 Cervicalgia: Secondary | ICD-10-CM

## 2022-03-04 DIAGNOSIS — S060X0A Concussion without loss of consciousness, initial encounter: Secondary | ICD-10-CM

## 2022-03-04 DIAGNOSIS — H532 Diplopia: Secondary | ICD-10-CM | POA: Diagnosis not present

## 2022-03-04 NOTE — Patient Instructions (Addendum)
Good to see you  Continue vestibular therapy and start physical therapy for your neck.  I recommend no more neck massages as they appear to be triggering symptoms Recommend decreasing work to only working 4 to 6 hours maximum a day Continue to use Tylenol as needed for headaches Return to clinic in 4 weeks

## 2022-03-08 ENCOUNTER — Other Ambulatory Visit: Payer: Commercial Managed Care - HMO
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
## 2022-03-29 ENCOUNTER — Other Ambulatory Visit: Payer: Self-pay | Admitting: Sports Medicine

## 2022-03-29 DIAGNOSIS — S060X0A Concussion without loss of consciousness, initial encounter: Secondary | ICD-10-CM

## 2022-04-01 ENCOUNTER — Ambulatory Visit (INDEPENDENT_AMBULATORY_CARE_PROVIDER_SITE_OTHER): Payer: Commercial Managed Care - HMO | Admitting: Sports Medicine

## 2022-04-01 VITALS — BP 126/68 | HR 76 | Ht 64.0 in | Wt 189.0 lb

## 2022-04-01 DIAGNOSIS — R270 Ataxia, unspecified: Secondary | ICD-10-CM | POA: Diagnosis not present

## 2022-04-01 DIAGNOSIS — H532 Diplopia: Secondary | ICD-10-CM

## 2022-04-01 DIAGNOSIS — S060X0A Concussion without loss of consciousness, initial encounter: Secondary | ICD-10-CM | POA: Diagnosis not present

## 2022-04-01 DIAGNOSIS — G44319 Acute post-traumatic headache, not intractable: Secondary | ICD-10-CM

## 2022-04-01 NOTE — Patient Instructions (Addendum)
Good to see you  Call and ask for a disc of images and report Opthalmology referral  2 week follow up

## 2022-04-12 ENCOUNTER — Telehealth: Payer: Self-pay | Admitting: Sports Medicine

## 2022-04-12 NOTE — Telephone Encounter (Signed)
Pt calling for MRI Head results, done at [facility] I can see these in Care Everywhere but provider has not reviewed.

## 2022-04-12 NOTE — Telephone Encounter (Signed)
Pt informed will discuss at f/u 9/1
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
## 2022-04-15 ENCOUNTER — Ambulatory Visit (INDEPENDENT_AMBULATORY_CARE_PROVIDER_SITE_OTHER): Payer: Commercial Managed Care - HMO | Admitting: Sports Medicine

## 2022-04-15 VITALS — HR 74 | Ht 64.0 in | Wt 190.0 lb

## 2022-04-15 DIAGNOSIS — S060X0A Concussion without loss of consciousness, initial encounter: Secondary | ICD-10-CM

## 2022-04-15 DIAGNOSIS — G44319 Acute post-traumatic headache, not intractable: Secondary | ICD-10-CM | POA: Diagnosis not present

## 2022-04-15 DIAGNOSIS — M542 Cervicalgia: Secondary | ICD-10-CM

## 2022-04-15 DIAGNOSIS — R270 Ataxia, unspecified: Secondary | ICD-10-CM

## 2022-04-15 DIAGNOSIS — H532 Diplopia: Secondary | ICD-10-CM

## 2022-04-15 MED ORDER — AMITRIPTYLINE HCL 10 MG PO TABS: 10.0000 mg | ORAL_TABLET | Freq: Every day | ORAL | 0 refills | Status: DC

## 2022-04-15 NOTE — Patient Instructions (Addendum)
Good to see you  [facility] [phone] Amitriptyline 10 mg nightly for 1 week if no side effects may increase to 20 mg nightly ( 10 mg 2 tablets)  3 week follow up
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
## 2022-05-06 ENCOUNTER — Ambulatory Visit (INDEPENDENT_AMBULATORY_CARE_PROVIDER_SITE_OTHER): Payer: Commercial Managed Care - HMO | Admitting: Sports Medicine

## 2022-05-06 VITALS — BP 110/70 | HR 63 | Ht 64.0 in | Wt 189.0 lb

## 2022-05-06 DIAGNOSIS — M542 Cervicalgia: Secondary | ICD-10-CM | POA: Diagnosis not present

## 2022-05-06 DIAGNOSIS — S060X0A Concussion without loss of consciousness, initial encounter: Secondary | ICD-10-CM

## 2022-05-06 DIAGNOSIS — G44319 Acute post-traumatic headache, not intractable: Secondary | ICD-10-CM

## 2022-05-06 DIAGNOSIS — R270 Ataxia, unspecified: Secondary | ICD-10-CM | POA: Diagnosis not present

## 2022-05-06 DIAGNOSIS — H532 Diplopia: Secondary | ICD-10-CM

## 2022-05-06 NOTE — Patient Instructions (Addendum)
Good to see you  Start amitriptyline 10 mg nightly for  1 week after  1 week increase to 20 mg (2 pills) a night if you run low on medication  call us and ask for a refill  Get new prescription glasses  3 week follow up

## 2022-05-17 ENCOUNTER — Other Ambulatory Visit: Payer: Self-pay | Admitting: Sports Medicine
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
## 2022-05-27 ENCOUNTER — Ambulatory Visit: Payer: Commercial Managed Care - HMO | Admitting: Sports Medicine

## 2022-06-14 ENCOUNTER — Other Ambulatory Visit: Payer: Self-pay | Admitting: Sports Medicine

## 2022-08-17 DIAGNOSIS — K219 Gastro-esophageal reflux disease without esophagitis: Secondary | ICD-10-CM | POA: Diagnosis not present

## 2022-08-17 DIAGNOSIS — L719 Rosacea, unspecified: Secondary | ICD-10-CM | POA: Diagnosis not present

## 2022-08-17 DIAGNOSIS — K5900 Constipation, unspecified: Secondary | ICD-10-CM | POA: Diagnosis not present

## 2022-09-11 DIAGNOSIS — R059 Cough, unspecified: Secondary | ICD-10-CM | POA: Diagnosis not present

## 2022-09-11 DIAGNOSIS — R079 Chest pain, unspecified: Secondary | ICD-10-CM | POA: Diagnosis not present

## 2022-10-06 ENCOUNTER — Emergency Department (HOSPITAL_COMMUNITY)
Admission: EM | Admit: 2022-10-06 | Discharge: 2022-10-07 | Disposition: A | Discharge status: Home / Self Care | Payer: 59 | Attending: Emergency Medicine | Admitting: Emergency Medicine

## 2022-10-06 ENCOUNTER — Emergency Department (HOSPITAL_COMMUNITY): Payer: 59

## 2022-10-06 ENCOUNTER — Encounter (HOSPITAL_COMMUNITY): Payer: Self-pay | Admitting: *Deleted

## 2022-10-06 ENCOUNTER — Other Ambulatory Visit: Payer: Self-pay

## 2022-10-06 DIAGNOSIS — R079 Chest pain, unspecified: Secondary | ICD-10-CM | POA: Diagnosis not present

## 2022-10-06 DIAGNOSIS — E039 Hypothyroidism, unspecified: Secondary | ICD-10-CM | POA: Insufficient documentation

## 2022-10-06 DIAGNOSIS — Z79899 Other long term (current) drug therapy: Secondary | ICD-10-CM | POA: Insufficient documentation

## 2022-10-06 DIAGNOSIS — R000 Tachycardia, unspecified: Secondary | ICD-10-CM | POA: Diagnosis not present

## 2022-10-06 DIAGNOSIS — I700 Atherosclerosis of aorta: Secondary | ICD-10-CM | POA: Diagnosis not present

## 2022-10-06 DIAGNOSIS — G4489 Other headache syndrome: Secondary | ICD-10-CM | POA: Diagnosis not present

## 2022-10-06 DIAGNOSIS — R0789 Other chest pain: Secondary | ICD-10-CM | POA: Diagnosis not present

## 2022-10-06 DIAGNOSIS — Z7982 Long term (current) use of aspirin: Secondary | ICD-10-CM | POA: Diagnosis not present

## 2022-10-06 DIAGNOSIS — R519 Headache, unspecified: Secondary | ICD-10-CM | POA: Insufficient documentation

## 2022-10-06 DIAGNOSIS — R42 Dizziness and giddiness: Secondary | ICD-10-CM | POA: Diagnosis not present

## 2022-10-06 DIAGNOSIS — K5900 Constipation, unspecified: Secondary | ICD-10-CM | POA: Diagnosis not present

## 2022-10-06 DIAGNOSIS — I10 Essential (primary) hypertension: Secondary | ICD-10-CM | POA: Diagnosis not present

## 2022-10-06 LAB — CBC
HCT: 39.1 % (ref 36.0–46.0)
Hemoglobin: 13.5 g/dL (ref 12.0–15.0)
MCH: 31 pg (ref 26.0–34.0)
MCHC: 34.5 g/dL (ref 30.0–36.0)
MCV: 89.7 fL (ref 80.0–100.0)
Platelets: 219 10*3/uL (ref 150–400)
RBC: 4.36 MIL/uL (ref 3.87–5.11)
RDW: 12.9 % (ref 11.5–15.5)
WBC: 5.2 10*3/uL (ref 4.0–10.5)
nRBC: 0 % (ref 0.0–0.2)

## 2022-10-06 NOTE — ED Triage Notes (Signed)
Patient reported to have onset of chest pressure, headache, dizziness, and nausea after her shower.  Patient has hx of hypertension.  Bp was elevated so ems called to her home.  Patient received 324 aspirin and nitro x 1 sl. CP decreased from 9/10 to 5/10.  Cbg 137.  Patient 187/72, after nitro 149/90.  HR 110.  Patient arrives alert and oriented.  She is grabbing her chest.  Son is at bedside.

## 2022-10-07 ENCOUNTER — Emergency Department (HOSPITAL_COMMUNITY): Payer: 59

## 2022-10-07 DIAGNOSIS — I700 Atherosclerosis of aorta: Secondary | ICD-10-CM | POA: Diagnosis not present

## 2022-10-07 DIAGNOSIS — K5900 Constipation, unspecified: Secondary | ICD-10-CM | POA: Diagnosis not present

## 2022-10-07 DIAGNOSIS — R079 Chest pain, unspecified: Secondary | ICD-10-CM | POA: Diagnosis not present

## 2022-10-07 LAB — BASIC METABOLIC PANEL
Anion gap: 11 (ref 5–15)
BUN: 12 mg/dL (ref 8–23)
CO2: 23 mmol/L (ref 22–32)
Calcium: 9.6 mg/dL (ref 8.9–10.3)
Chloride: 105 mmol/L (ref 98–111)
Creatinine, Ser: 0.7 mg/dL (ref 0.44–1.00)
GFR, Estimated: >60 mL/min (ref >60)
Glucose, Bld: 117 mg/dL — ABNORMAL HIGH (ref 70–99)
Potassium: 3.6 mmol/L (ref 3.5–5.1)
Sodium: 139 mmol/L (ref 135–145)

## 2022-10-07 LAB — TROPONIN I (HIGH SENSITIVITY)
Troponin I (High Sensitivity): 4 ng/L (ref <18)
Troponin I (High Sensitivity): 6 ng/L (ref <18)

## 2022-10-07 MED ORDER — IOHEXOL 350 MG/ML SOLN
100.0000 mL | Freq: Once | INTRAVENOUS | Status: AC | PRN
  Administered 2022-10-07: 100 mL via INTRAVENOUS

## 2022-10-07 NOTE — ED Provider Notes (Signed)
[facility] Provider Note   CSN: [id number] Arrival date & time: 10/06/22  2243     History  Chief Complaint  Patient presents with   Chest Pain   Headache   Dizziness   Nausea    Summer Roberts is a 62 y.o. female.  62 year old female with history of hypertension and hypothyroidism that presents the ER today secondary to multiple symptoms.  Patient states she is had a good day, normal day and when she was taking a shower she got out of shower she started having some chest discomfort.  This was associated with some nausea and lightheadedness.  Maybe Levay dyspnea but not a prominent symptom.  She started feeling anxious like she is going to die.  She went to lay down to see if she can watch something on TV or her computer to make it better for about 20 minutes it did not get any better she checked her blood pressure and it was elevated to [id number] systolic so she took an extra dose of her blood pressure medication.  We rechecked her blood pressure in 20 minutes was [id number] systolic so she took another dose of her blood pressure medicine and also took a dose of Ativan at that time.  She had an episode where she was shaking all over which worried her so she called her son.  911 was called and on fire department arrival her blood pressure was [id number] systolic. On EMS arrival she was given 4 aspirin and 1 nitroglycerin which helped most of her symptoms and her blood pressure to get better.   Chest Pain Associated symptoms: dizziness and headache   Headache Associated symptoms: dizziness   Dizziness Associated symptoms: chest pain and headaches        Home Medications Prior to Admission medications   Medication Sig Start Date End Date Taking? Authorizing Provider  acetaminophen (TYLENOL) 500 MG tablet Take 1,000 mg by mouth every 6 (six) hours as needed for mild pain.    [provider]  amitriptyline (ELAVIL) 10 MG tablet TAKE 1 TABLET BY MOUTH  EVERYDAY AT BEDTIME 06/14/22   Glennon Mac, DO  aspirin EC 81 MG tablet Take 81 mg by mouth daily as needed (for pain or headaches).  03/18/09   [provider]  benzonatate (TESSALON) 100 MG capsule Take 1 capsule (100 mg total) by mouth every 8 (eight) hours. 03/29/20   Avegno, Darrelyn Hillock, FNP  hydrOXYzine (ATARAX/VISTARIL) 25 MG tablet Take 1 tablet (25 mg total) by mouth every 6 (six) hours. 03/19/21   Wurst, [place], PA-C  methocarbamol (ROBAXIN) 500 MG tablet Take 1 tablet (500 mg total) by mouth 4 (four) times daily. 12/03/21   Carlisle Cater, PA-C  SYNTHROID 100 MCG tablet Take 100 mcg by mouth daily. 01/12/20   [provider]  triamcinolone cream (KENALOG) 0.1 % Apply 1 application topically 2 (two) times daily. 03/22/21   Venter, Margaux, PA-C  VALERIAN ROOT PO Take 2 capsules by mouth 2 (two) times daily.    [provider]  losartan (COZAAR) 25 MG tablet Take 25 mg by mouth daily. 01/16/18 03/26/20  [provider]      Allergies    Patient has no known allergies.    Review of Systems   Review of Systems  Cardiovascular:  Positive for chest pain.  Neurological:  Positive for dizziness and headaches.    Physical Exam Updated Vital Signs BP (!) 103/57   Pulse 81  Temp 98.4 F (36.9 C) (Oral)   Resp 16   Ht '5\' 4"'$  (1.626 m)   Wt 81.6 kg   LMP  (LMP Unknown)   SpO2 97%   BMI 30.90 kg/m  Physical Exam Vitals and nursing note reviewed.  Constitutional:      Appearance: She is well-developed.  HENT:     Head: Normocephalic and atraumatic.  Cardiovascular:     Rate and Rhythm: Normal rate and regular rhythm.  Pulmonary:     Effort: No respiratory distress.     Breath sounds: No stridor.  Chest:     Chest wall: No mass, tenderness or edema.  Abdominal:     General: There is no distension.     Palpations: Abdomen is soft.  Musculoskeletal:     Cervical back: Normal range of motion.     Right lower leg: No edema.     Left lower leg:  No edema.  Skin:    General: Skin is warm and dry.  Neurological:     Mental Status: She is alert.     ED Results / Procedures / Treatments   Labs (all labs ordered are listed, but only abnormal results are displayed) Labs Reviewed  BASIC METABOLIC PANEL - Abnormal; Notable for the following components:      Result Value   Glucose, Bld 117 (*)    All other components within normal limits  CBC  TROPONIN I (HIGH SENSITIVITY)  TROPONIN I (HIGH SENSITIVITY)    EKG EKG Interpretation  Date/Time:  Thursday October 06 2022 22:55:30 EST Ventricular Rate:  95 PR Interval:  152 QRS Duration: 86 QT Interval:  374 QTC Calculation: 469 R Axis:   -67 Text Interpretation: Normal sinus rhythm Left axis deviation Abnormal ECG When compared with ECG of 24-May-2021 18:05, PREVIOUS ECG IS PRESENT No significant change since last tracing Confirmed by Merrily Pew [phone]) on 10/06/2022 11:56:18 PM  Radiology CT Angio Chest/Abd/Pel for Dissection W and/or Wo Contrast  Result Date: 10/07/2022 CLINICAL DATA:  Acute aortic syndrome suspected. EXAM: CT ANGIOGRAPHY CHEST, ABDOMEN AND PELVIS TECHNIQUE: Non-contrast CT of the chest was initially obtained. Multidetector CT imaging through the chest, abdomen and pelvis was performed using the standard protocol during bolus administration of intravenous contrast. Multiplanar reconstructed images and MIPs were obtained and reviewed to evaluate the vascular anatomy. RADIATION DOSE REDUCTION: This exam was performed according to the departmental dose-optimization program which includes automated exposure control, adjustment of the mA and/or kV according to patient size and/or use of iterative reconstruction technique. CONTRAST:  184m OMNIPAQUE IOHEXOL 350 MG/ML SOLN COMPARISON:  Chest CT dated 05/24/2021. FINDINGS: CTA CHEST FINDINGS Cardiovascular: There is no cardiomegaly or pericardial effusion. The thoracic aorta is unremarkable. The origins of the great  vessels of the aortic arch are patent. No pulmonary artery embolus identified. Mediastinum/Nodes: There is no hilar or mediastinal adenopathy. The esophagus is grossly unremarkable. No mediastinal fluid collection. Lungs/Pleura: The lungs are clear. There is no pleural effusion or pneumothorax. The central airways are patent. Musculoskeletal: No acute osseous pathology. Review of the MIP images confirms the above findings. CTA ABDOMEN AND PELVIS FINDINGS VASCULAR Aorta: Normal caliber aorta without aneurysm, dissection, vasculitis or significant stenosis. Celiac: Patent without evidence of aneurysm, dissection, vasculitis or significant stenosis. SMA: Patent without evidence of aneurysm, dissection, vasculitis or significant stenosis. Renals: Both renal arteries are patent without evidence of aneurysm, dissection, vasculitis, fibromuscular dysplasia or significant stenosis. IMA: Patent without evidence of aneurysm, dissection, vasculitis or significant stenosis. Inflow:  Patent without evidence of aneurysm, dissection, vasculitis or significant stenosis. Veins: No obvious venous abnormality within the limitations of this arterial phase study. Review of the MIP images confirms the above findings. NON-VASCULAR No intra-abdominal free air.  No free fluid. Hepatobiliary: The liver is unremarkable. No biliary dilatation. Cholecystectomy. Pancreas: Unremarkable. No pancreatic ductal dilatation or surrounding inflammatory changes. Spleen: Normal in size without focal abnormality. Adrenals/Urinary Tract: The adrenal glands are unremarkable. The kidneys, visualized ureters, and urinary bladder appear unremarkable. Stomach/Bowel: There is moderate stool throughout the colon. There is no bowel obstruction or active inflammation. The appendix is normal. Lymphatic: No adenopathy. Reproductive: Hysterectomy.  No adnexal masses. Other: None Musculoskeletal: No acute or significant osseous findings. Review of the MIP images confirms  the above findings. IMPRESSION: 1. No acute intrathoracic, abdominal, or pelvic pathology. No aortic aneurysm or dissection. 2. Moderate colonic stool burden. No bowel obstruction. Normal appendix. Electronically Signed   By: Anner Crete M.D.   On: 10/07/2022 01:37   DG Chest 2 View  Result Date: 10/06/2022 CLINICAL DATA:  Chest pain EXAM: CHEST - 2 VIEW COMPARISON:  05/24/2021 FINDINGS: The heart size and mediastinal contours are within normal limits. Both lungs are clear. The visualized skeletal structures are unremarkable. IMPRESSION: No active cardiopulmonary disease. Electronically Signed   By: Inez Catalina M.D.   On: 10/06/2022 23:33    Procedures Procedures    Medications Ordered in ED Medications  iohexol (OMNIPAQUE) 350 MG/ML injection 100 mL (100 mLs Intravenous Contrast Given 10/07/22 0127)    ED Course/ Medical Decision Making/ A&P             HEART Score: 3                Medical Decision Making Amount and/or Complexity of Data Reviewed Labs: ordered. Radiology: ordered.  Risk Prescription drug management.   Low heart score making ACS unlikely.  Secondary to the significant high blood pressure, chest pain and neurologic symptoms we will get a dissection study to make sure has not related to that.  Will get a second troponin.  Will treat symptomatically in the meantime.  Disposition pending results. ECG reassuring. Negative delta troponins, making ACS unlikely. Dissection study negative for dissection, PE, lung abnormalities, mediastinal abnormalities. Patient continued pain free and symptom free. BP slightly low but likely related to taking a couple BP meds. Will fu w/ cardiology and PCP and return here for new/worsening symptoms.   Final Clinical Impression(s) / ED Diagnoses Final diagnoses:  Hypertension, unspecified type  Chest pain, unspecified type    Rx / DC Orders ED Discharge Orders          Ordered    Ambulatory referral to Cardiology         10/07/22 0219              Merrily Pew, MD 10/07/22 [phone]

## 2022-10-12 DIAGNOSIS — E039 Hypothyroidism, unspecified: Secondary | ICD-10-CM | POA: Diagnosis not present

## 2022-10-12 DIAGNOSIS — I10 Essential (primary) hypertension: Secondary | ICD-10-CM | POA: Diagnosis not present

## 2022-10-12 DIAGNOSIS — F411 Generalized anxiety disorder: Secondary | ICD-10-CM | POA: Diagnosis not present

## 2022-10-20 NOTE — Progress Notes (Signed)
Cardiology Office Note:   Date:  10/21/2022  NAME:  Summer Roberts    MRN: [id number] DOB:  [birth year]   PCP:  Fanny Bien, MD  Cardiologist:  None  Electrophysiologist:  None   Referring MD: Merrily Pew, MD   Chief Complaint  Patient presents with   Chest Pain   History of Present Illness:   Summer Roberts is a 62 y.o. female with a hx of HTN who is being seen today for the evaluation of chest pain at the request of Mesner, Corene Cornea, MD. Seen in th ER 2/23 for CP and elevated BP. CT dissection study negative. No coronary calcium.   She reports over the past 2 to 3 weeks she has had intense burning in her left chest and shoulder.  Symptoms can occur at the end of the day.  She reports activity does worsen it.  She reports that she is not stressed.  She was seen in the emergency room with negative workup.  Her blood pressure was elevated during that emergency room visit.  BP has been well-controlled since then.  No further episodes.  She reports she is just felt short of breath and with persistent shoulder and chest discomfort.  Symptoms can occur with activity.  They are better with rest.  She did have a CT chest which showed no coronary calcium.  She has a strong family history of heart disease in her brother.  She is quite concerned about her heart.  Her EKG demonstrates sinus rhythm with left axis deviation.  She reports movement of the shoulder can also exacerbate things.  She is not diabetic.  She has never had a heart attack or stroke.  She does not smoke.  No alcohol or drug use is reported.  She is married.  She has 3 children.  She has 7 grandchildren.  Her husband owns a Office manager business.  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  She reports that all of her labs recently have been well-controlled through her primary care physician.  She had a thyroid studies checked per her report that was normal.  Past Medical History: Past Medical History:  Diagnosis Date   Thyroid  disease     Past Surgical History: Past Surgical History:  Procedure Laterality Date   LAPAROSCOPIC CHOLECYSTECTOMY  01/08/2019    Current Medications: Current Meds  Medication Sig   acetaminophen (TYLENOL) 500 MG tablet Take 1,000 mg by mouth every 6 (six) hours as needed for mild pain.   aspirin EC 81 MG tablet Take 81 mg by mouth daily as needed (for pain or headaches).    metoprolol tartrate (LOPRESSOR) 100 MG tablet Take 1 tablet by mouth once for procedure.   SYNTHROID 100 MCG tablet Take 100 mcg by mouth daily.   VALERIAN ROOT PO Take 2 capsules by mouth 2 (two) times daily.   [DISCONTINUED] benzonatate (TESSALON) 100 MG capsule Take 1 capsule (100 mg total) by mouth every 8 (eight) hours.     Allergies:    Patient has no known allergies.   Social History: Social History   Socioeconomic History   Marital status: Married    Spouse name: Not on file   Number of children: 3   Years of education: Not on file   Highest education level: Not on file  Occupational History   Occupation: Office manager  Tobacco Use   Smoking status: Never   Smokeless tobacco: Never  Vaping Use   Vaping Use: Never used  Substance and Sexual Activity   Alcohol use: No   Drug use: Never   Sexual activity: Not Currently    Birth control/protection: None  Other Topics Concern   Not on file  Social History Narrative   Not on file   Social Determinants of Health   Financial Resource Strain: Not on file  Food Insecurity: Not on file  Transportation Needs: Not on file  Physical Activity: Not on file  Stress: Not on file  Social Connections: Not on file     Family History: The patient's family history includes Cancer in her mother; Heart attack in her brother; Hypertension in her maternal grandmother and paternal grandfather; Stroke in her brother.  ROS:   All other ROS reviewed and negative. Pertinent positives noted in the HPI.     EKGs/Labs/Other Studies Reviewed:   The following  studies were personally reviewed by me today:  EKG:  EKG is ordered today.  The ekg ordered today demonstrates normal sinus rhythm heart rate 95, left axis deviation, no acute ischemic changes or evidence of infarction, and was personally reviewed by me.   Recent Labs: 10/06/2022: BUN 12; Creatinine, Ser 0.70; Hemoglobin 13.5; Platelets 219; Potassium 3.6; Sodium 139   Recent Lipid Panel    Component Value Date/Time   TRIG 102 04/02/2020 1526    Physical Exam:   VS:  BP 128/74   Pulse 95   Wt 189 lb 9.6 oz (86 kg)   LMP  (LMP Unknown)   SpO2 98%   BMI 32.54 kg/m    Wt Readings from Last 3 Encounters:  10/21/22 189 lb 9.6 oz (86 kg)  10/06/22 180 lb (81.6 kg)  05/06/22 189 lb (85.7 kg)    General: Well nourished, well developed, in no acute distress Head: Atraumatic, normal size  Eyes: PEERLA, EOMI  Neck: Supple, no JVD Endocrine: No thryomegaly Cardiac: Normal S1, S2; RRR; no murmurs, rubs, or gallops Lungs: Clear to auscultation bilaterally, no wheezing, rhonchi or rales  Abd: Soft, nontender, no hepatomegaly  Ext: No edema, pulses 2+ Musculoskeletal: No deformities, BUE and BLE strength normal and equal Skin: Warm and dry, no rashes   Neuro: Alert and oriented to person, place, time, and situation, CNII-XII grossly intact, no focal deficits  Psych: Normal mood and affect   ASSESSMENT:   Summer Roberts is a 63 y.o. female who presents for the following: 1. Precordial pain   2. SOB (shortness of breath)   3. Left shoulder pain, unspecified chronicity     PLAN:   1. Precordial pain 2. SOB (shortness of breath) 3. Left shoulder pain, unspecified chronicity -Suspect noncardiac chest pain.  Suspect this could be musculoskeletal.  She was seen in the ER with negative workup for acute coronary syndrome.  Her coronary calcium on my review of her chest CT was negative.  She does have a strong family history of heart disease and giving ongoing symptoms that are exertional I  recommended coronary CTA to exclude this.  She will take 100 mg metoprolol tartrate 2 hours before the scan.  I would also like to obtain an echocardiogram.  She has had fluctuations of blood pressure.  It seems to be stable.  No significant stress.  She is on Synthroid.  She will follow-up with the primary care physician for routine lab work on that.  All of her labs in the hospital were normal.  Her EKG demonstrates left axis deviation which could be normal.  Echo will provide guidance on this.  If workup is negative suspect this could be musculoskeletal in nature.  She will follow-up with her primary care physician based on the results of her testing.     Disposition: Return if symptoms worsen or fail to improve.  Medication Adjustments/Labs and Tests Ordered: Current medicines are reviewed at length with the patient today.  Concerns regarding medicines are outlined above.  Orders Placed This Encounter  Procedures   CT CORONARY MORPH W/CTA COR W/SCORE W/CA W/CM &/OR WO/CM   EKG 12-Lead   ECHOCARDIOGRAM COMPLETE   Meds ordered this encounter  Medications   metoprolol tartrate (LOPRESSOR) 100 MG tablet    Sig: Take 1 tablet by mouth once for procedure.    Dispense:  1 tablet    Refill:  0    Patient Instructions  Medication Instructions:  Take Metoprolol 100 mg two hours before CT when scheduled.  *If you need a refill on your cardiac medications before your next appointment, please call your pharmacy*  Testing/Procedures:  Coronary CTA- they will call you to schedule.   Echocardiogram - Your physician has requested that you have an echocardiogram. Echocardiography is a painless test that uses sound waves to create images of your heart. It provides your doctor with information about the size and shape of your heart and how well your heart's chambers and valves are working. This procedure takes approximately one hour. There are no restrictions for this procedure.      Follow-Up: At [facility], you and your health needs are our priority.  As part of our continuing mission to provide you with exceptional heart care, we have created designated Provider Care Teams.  These Care Teams include your primary Cardiologist (physician) and Advanced Practice Providers (APPs -  Physician Assistants and Nurse Practitioners) who all work together to provide you with the care you need, when you need it.  We recommend signing up for the patient portal called "MyChart".  Sign up information is provided on this After Visit Summary.  MyChart is used to connect with patients for Virtual Visits (Telemedicine).  Patients are able to view lab/test results, encounter notes, upcoming appointments, etc.  Non-urgent messages can be sent to your provider as well.   To learn more about what you can do with MyChart, go to [email or web address].    Your next appointment:   As needed  Provider:   Eleonore Chiquito, MD  Other Instructions   Your cardiac CT will be scheduled at one of the below locations:   [facility] [address] [place], [place] [address] [phone]   If scheduled at [facility], please arrive at the [facility] and Children's Entrance (Entrance C2) of [facility] 30 minutes prior to test start time. You can use the FREE valet parking offered at entrance C (encouraged to control the heart rate for the test)  Proceed to the [facility] Radiology Department (first floor) to check-in and test prep.  All radiology patients and guests should use entrance C2 at [facility], accessed from [facility], even though the hospital's physical address listed is [address]    Please follow these instructions carefully (unless otherwise directed):    On the Night Before the Test: Be sure to Drink plenty of water. Do not consume any caffeinated/decaffeinated beverages or chocolate 12 hours prior to  your test. Do not take any antihistamines 12 hours prior to your test.  On the Day of the Test: Drink plenty of water  until 1 hour prior to the test. Do not eat any food 1 hour prior to test. You may take your regular medications prior to the test.  Take metoprolol (Lopressor) two hours prior to test. If you take Furosemide/Hydrochlorothiazide/Spironolactone, please HOLD on the morning of the test. FEMALES- please wear underwire-free bra if available, avoid dresses & tight clothing       After the Test: Drink plenty of water. After receiving IV contrast, you may experience a mild flushed feeling. This is normal. On occasion, you may experience a mild rash up to 24 hours after the test. This is not dangerous. If this occurs, you can take Benadryl 25 mg and increase your fluid intake. If you experience trouble breathing, this can be serious. If it is severe call 911 IMMEDIATELY. If it is mild, please call our office. If you take any of these medications: Glipizide/Metformin, Avandament, Glucavance, please do not take 48 hours after completing test unless otherwise instructed.  We will call to schedule your test 2-4 weeks out understanding that some insurance companies will need an authorization prior to the service being performed.   For non-scheduling related questions, please contact the cardiac imaging nurse navigator should you have any questions/concerns: Marchia Bond, Cardiac Imaging Nurse Navigator Gordy Clement, Cardiac Imaging Nurse Navigator [facility] Direct Office Dial: [phone]   For scheduling needs, including cancellations and rescheduling, please call [place], [phone].     Signed, Addison Naegeli. Audie Box, MD, [place]  [address] [place], [place] [address] [phone]  10/21/2022 9:48 AM

## 2022-10-21 ENCOUNTER — Encounter: Payer: Self-pay | Admitting: Cardiovascular Disease

## 2022-10-21 ENCOUNTER — Ambulatory Visit: Payer: Managed Care, Other (non HMO) | Attending: Cardiovascular Disease | Admitting: Cardiovascular Disease

## 2022-10-21 VITALS — BP 128/74 | HR 95 | Wt 189.6 lb

## 2022-10-21 DIAGNOSIS — R0602 Shortness of breath: Secondary | ICD-10-CM | POA: Diagnosis not present

## 2022-10-21 DIAGNOSIS — M25512 Pain in left shoulder: Secondary | ICD-10-CM

## 2022-10-21 DIAGNOSIS — R072 Precordial pain: Secondary | ICD-10-CM | POA: Diagnosis not present

## 2022-10-21 MED ORDER — METOPROLOL TARTRATE 100 MG PO TABS: ORAL_TABLET | ORAL | 0 refills | Status: AC

## 2022-10-21 NOTE — Patient Instructions (Addendum)
Medication Instructions:  Take Metoprolol 100 mg two hours before CT when scheduled.  *If you need a refill on your cardiac medications before your next appointment, please call your pharmacy*  Testing/Procedures:  Coronary CTA- they will call you to schedule.   Echocardiogram - Your physician has requested that you have an echocardiogram. Echocardiography is a painless test that uses sound waves to create images of your heart. It provides your doctor with information about the size and shape of your heart and how well your heart's chambers and valves are working. This procedure takes approximately one hour. There are no restrictions for this procedure.     Follow-Up: At [facility], you and your health needs are our priority.  As part of our continuing mission to provide you with exceptional heart care, we have created designated Provider Care Teams.  These Care Teams include your primary Cardiologist (physician) and Advanced Practice Providers (APPs -  Physician Assistants and Nurse Practitioners) who all work together to provide you with the care you need, when you need it.  We recommend signing up for the patient portal called "MyChart".  Sign up information is provided on this After Visit Summary.  MyChart is used to connect with patients for Virtual Visits (Telemedicine).  Patients are able to view lab/test results, encounter notes, upcoming appointments, etc.  Non-urgent messages can be sent to your provider as well.   To learn more about what you can do with MyChart, go to [email or web address].    Your next appointment:   As needed  Provider:   Eleonore Chiquito, MD  Other Instructions   Your cardiac CT will be scheduled at one of the below locations:   [facility] [address] [place], [place] [address] [phone]   If scheduled at [facility], please arrive at the [facility] and Children's Entrance (Entrance C2) of [facility] 30  minutes prior to test start time. You can use the FREE valet parking offered at entrance C (encouraged to control the heart rate for the test)  Proceed to the [facility] Radiology Department (first floor) to check-in and test prep.  All radiology patients and guests should use entrance C2 at [facility], accessed from [facility], even though the hospital's physical address listed is [address].    Please follow these instructions carefully (unless otherwise directed):    On the Night Before the Test: Be sure to Drink plenty of water. Do not consume any caffeinated/decaffeinated beverages or chocolate 12 hours prior to your test. Do not take any antihistamines 12 hours prior to your test.  On the Day of the Test: Drink plenty of water until 1 hour prior to the test. Do not eat any food 1 hour prior to test. You may take your regular medications prior to the test.  Take metoprolol (Lopressor) two hours prior to test. If you take Furosemide/Hydrochlorothiazide/Spironolactone, please HOLD on the morning of the test. FEMALES- please wear underwire-free bra if available, avoid dresses & tight clothing       After the Test: Drink plenty of water. After receiving IV contrast, you may experience a mild flushed feeling. This is normal. On occasion, you may experience a mild rash up to 24 hours after the test. This is not dangerous. If this occurs, you can take Benadryl 25 mg and increase your fluid intake. If you experience trouble breathing, this can be serious. If it is severe call 911 IMMEDIATELY. If it  is mild, please call our office. If you take any of these medications: Glipizide/Metformin, Avandament, Glucavance, please do not take 48 hours after completing test unless otherwise instructed.  We will call to schedule your test 2-4 weeks out understanding that some insurance companies will need an authorization prior to the service being performed.   For  non-scheduling related questions, please contact the cardiac imaging nurse navigator should you have any questions/concerns: Marchia Bond, Cardiac Imaging Nurse Navigator Gordy Clement, Cardiac Imaging Nurse Navigator [facility] Direct Office Dial: [phone]   For scheduling needs, including cancellations and rescheduling, please call [place], [phone].

## 2022-10-28 ENCOUNTER — Telehealth (HOSPITAL_COMMUNITY): Payer: Self-pay | Admitting: *Deleted

## 2022-10-28 NOTE — Telephone Encounter (Signed)
Reaching out to patient to offer assistance regarding upcoming cardiac imaging study; pt verbalizes understanding of appt date/time, parking situation and where to check in, pre-test NPO status and medications ordered, and verified current allergies; name and call back number provided for further questions should they arise  Stefano Trulson RN Navigator Cardiac Imaging [facility] [phone] office [phone] cell  Patient to take 100mg metoprolol tartrate two hours prior to her cardiac CT scan.  She is aware to arrive at 11am. 

## 2022-10-31 ENCOUNTER — Ambulatory Visit (HOSPITAL_COMMUNITY)
Admission: RE | Admit: 2022-10-31 | Discharge: 2022-10-31 | Disposition: A | Discharge status: Home / Self Care | Payer: 59 | Source: Ambulatory Visit | Attending: Cardiovascular Disease | Admitting: Cardiovascular Disease

## 2022-10-31 DIAGNOSIS — R072 Precordial pain: Secondary | ICD-10-CM

## 2022-10-31 MED ORDER — NITROGLYCERIN 0.4 MG SL SUBL
SUBLINGUAL_TABLET | SUBLINGUAL | Status: AC
  Filled 2022-10-31: qty 2

## 2022-10-31 MED ORDER — NITROGLYCERIN 0.4 MG SL SUBL
0.8000 mg | SUBLINGUAL_TABLET | Freq: Once | SUBLINGUAL | Status: AC
  Administered 2022-10-31: 0.8 mg via SUBLINGUAL

## 2022-10-31 MED ORDER — IOHEXOL 350 MG/ML SOLN
95.0000 mL | Freq: Once | INTRAVENOUS | Status: AC | PRN
  Administered 2022-10-31: 95 mL via INTRAVENOUS

## 2022-11-03 ENCOUNTER — Encounter (HOSPITAL_BASED_OUTPATIENT_CLINIC_OR_DEPARTMENT_OTHER): Payer: Self-pay | Admitting: Emergency Medicine

## 2022-11-03 ENCOUNTER — Emergency Department (HOSPITAL_BASED_OUTPATIENT_CLINIC_OR_DEPARTMENT_OTHER): Payer: 59 | Admitting: Radiology

## 2022-11-03 ENCOUNTER — Other Ambulatory Visit: Payer: Self-pay

## 2022-11-03 ENCOUNTER — Emergency Department (HOSPITAL_BASED_OUTPATIENT_CLINIC_OR_DEPARTMENT_OTHER)
Admission: EM | Admit: 2022-11-03 | Discharge: 2022-11-03 | Disposition: A | Discharge status: Home / Self Care | Payer: 59 | Attending: Emergency Medicine | Admitting: Emergency Medicine

## 2022-11-03 DIAGNOSIS — R0789 Other chest pain: Secondary | ICD-10-CM | POA: Diagnosis not present

## 2022-11-03 DIAGNOSIS — Z7982 Long term (current) use of aspirin: Secondary | ICD-10-CM | POA: Insufficient documentation

## 2022-11-03 DIAGNOSIS — R0602 Shortness of breath: Secondary | ICD-10-CM | POA: Diagnosis not present

## 2022-11-03 DIAGNOSIS — Z79899 Other long term (current) drug therapy: Secondary | ICD-10-CM | POA: Diagnosis not present

## 2022-11-03 DIAGNOSIS — R079 Chest pain, unspecified: Secondary | ICD-10-CM | POA: Diagnosis not present

## 2022-11-03 DIAGNOSIS — I10 Essential (primary) hypertension: Secondary | ICD-10-CM | POA: Insufficient documentation

## 2022-11-03 LAB — CBC WITH DIFFERENTIAL/PLATELET
Abs Immature Granulocytes: 0.01 10*3/uL (ref 0.00–0.07)
Basophils Absolute: 0 10*3/uL (ref 0.0–0.1)
Basophils Relative: 0 %
Eosinophils Absolute: 0.1 10*3/uL (ref 0.0–0.5)
Eosinophils Relative: 2 %
HCT: 39.7 % (ref 36.0–46.0)
Hemoglobin: 14.2 g/dL (ref 12.0–15.0)
Immature Granulocytes: 0 %
Lymphocytes Relative: 42 %
Lymphs Abs: 2.4 10*3/uL (ref 0.7–4.0)
MCH: 31.2 pg (ref 26.0–34.0)
MCHC: 35.8 g/dL (ref 30.0–36.0)
MCV: 87.3 fL (ref 80.0–100.0)
Monocytes Absolute: 0.5 10*3/uL (ref 0.1–1.0)
Monocytes Relative: 9 %
Neutro Abs: 2.7 10*3/uL (ref 1.7–7.7)
Neutrophils Relative %: 47 %
Platelets: 202 10*3/uL (ref 150–400)
RBC: 4.55 MIL/uL (ref 3.87–5.11)
RDW: 12.8 % (ref 11.5–15.5)
WBC: 5.8 10*3/uL (ref 4.0–10.5)
nRBC: 0 % (ref 0.0–0.2)

## 2022-11-03 LAB — BASIC METABOLIC PANEL
Anion gap: 6 (ref 5–15)
BUN: 12 mg/dL (ref 8–23)
CO2: 29 mmol/L (ref 22–32)
Calcium: 10.2 mg/dL (ref 8.9–10.3)
Chloride: 105 mmol/L (ref 98–111)
Creatinine, Ser: 0.65 mg/dL (ref 0.44–1.00)
GFR, Estimated: >60 mL/min (ref >60)
Glucose, Bld: 118 mg/dL — ABNORMAL HIGH (ref 70–99)
Potassium: 3.6 mmol/L (ref 3.5–5.1)
Sodium: 140 mmol/L (ref 135–145)

## 2022-11-03 LAB — TROPONIN I (HIGH SENSITIVITY): Troponin I (High Sensitivity): <2 ng/L (ref <18)

## 2022-11-03 MED ORDER — HYDROXYZINE HCL 25 MG PO TABS: 25.0000 mg | ORAL_TABLET | Freq: Three times a day (TID) | ORAL | 0 refills | Status: AC | PRN

## 2022-11-03 NOTE — ED Triage Notes (Signed)
Patient reports episodes of hypertension yesterday and is c/o chest pain x 3 weeks.  Patient seen for same a few days ago and had CT scan.

## 2022-11-03 NOTE — ED Notes (Signed)
Reviewed AVS with patient, patient expressed understanding of directions, denies further questions at this time. 

## 2022-11-03 NOTE — ED Provider Notes (Signed)
[place] [facility]  Provider Note  CSN: [id number] Arrival date & time: 11/03/22 0244  History Chief Complaint  Patient presents with   Hypertension   Chest Pain    Summer Roberts is a 62 y.o. female here for re-evaluation of chest pain and HTN which has been bothering her off and on for several weeks. She was seen in the ED on 2/22 with negative workup including delta trop and CTA chest. She was subsequently seen by Cardiology and sent for CT Coronaries on 3/18 with a calcium score of 0. She reports she has had increased BP (up to 160s) earlier tonight and return of L upper chest pain into L shoulder. She took two leftover lorazepam form prior prescription but when symptoms didn't improve and she was home alone she decided to come to the ED for evaluation. She is feeling a bit better now compared to earlier in the night.    Home Medications Prior to Admission medications   Medication Sig Start Date End Date Taking? Authorizing Provider  hydrOXYzine (ATARAX) 25 MG tablet Take 1 tablet (25 mg total) by mouth every 8 (eight) hours as needed for anxiety. 11/03/22  Yes Truddie Hidden, MD  acetaminophen (TYLENOL) 500 MG tablet Take 1,000 mg by mouth every 6 (six) hours as needed for mild pain.    [provider]  aspirin EC 81 MG tablet Take 81 mg by mouth daily as needed (for pain or headaches).  03/18/09   [provider]  metoprolol tartrate (LOPRESSOR) 100 MG tablet Take 1 tablet by mouth once for procedure. 10/21/22   Geralynn Rile, MD  SYNTHROID 100 MCG tablet Take 100 mcg by mouth daily. 01/12/20   [provider]  VALERIAN ROOT PO Take 2 capsules by mouth 2 (two) times daily.    [provider]  losartan (COZAAR) 25 MG tablet Take 25 mg by mouth daily. 01/16/18 03/26/20  [provider]     Allergies    Patient has no known allergies.   Review of Systems   Review of Systems Please see HPI for  pertinent positives and negatives  Physical Exam BP (!) 152/72   Pulse 89   Temp 98 F (36.7 C) (Oral)   Resp 16   Ht 5\' 4"  (1.626 m)   Wt 86 kg   LMP  (LMP Unknown)   SpO2 99%   BMI 32.54 kg/m   Physical Exam Vitals and nursing note reviewed.  Constitutional:      Appearance: Normal appearance.  HENT:     Head: Normocephalic and atraumatic.     Nose: Nose normal.     Mouth/Throat:     Mouth: Mucous membranes are moist.  Eyes:     Extraocular Movements: Extraocular movements intact.     Conjunctiva/sclera: Conjunctivae normal.  Cardiovascular:     Rate and Rhythm: Normal rate.  Pulmonary:     Effort: Pulmonary effort is normal.     Breath sounds: Normal breath sounds.  Abdominal:     General: Abdomen is flat.     Palpations: Abdomen is soft.     Tenderness: There is no abdominal tenderness.  Musculoskeletal:        General: No swelling. Normal range of motion.     Cervical back: Neck supple.  Skin:    General: Skin is warm and dry.  Neurological:     General: No focal deficit present.     Mental Status: She is  alert.  Psychiatric:        Mood and Affect: Mood normal.     ED Results / Procedures / Treatments   EKG EKG Interpretation  Date/Time:  Thursday November 03 2022 02:52:59 EDT Ventricular Rate:  88 PR Interval:  145 QRS Duration: 96 QT Interval:  371 QTC Calculation: 449 R Axis:   -47 Text Interpretation: Sinus rhythm LAD, consider left anterior fascicular block Nonspecific repol abnormality, lateral leads No significant change since last tracing Confirmed by Calvert Cantor [phone]) on 11/03/2022 2:55:08 AM  Procedures Procedures  Medications Ordered in the ED Medications - No data to display  Initial Impression and Plan  Patient here with ongoing intermittent CP. BP not significantly elevated here and improved without intervention while I was in the room. Reassuring workup in recent days. EKG without ischemic changes. Will check labs to ensure  no change from prior. Anticipate discharge if unchanged with continued outpatient evaluation.   ED Course   Clinical Course as of 11/03/22 0358  Thu Nov 03, 2022  0318 CBC is normal.  [CS]  0330 I personally viewed the images from radiology studies and agree with radiologist interpretation: CXR is clear [CS]  [id number] Trop is normal. Given duration of symptoms a single negative value is sufficient to rule out AMI.  [CS]  [id number] BMP is normal.  [CS]  [id number] ED workup is again unremarkable. No signs of emergent condition. She remains comfortable appearing with no distress and reassuring vitals. There may be some component of anxiety with her symptoms but recommend PCP follow up for further investigation. Rx for hydroxyzine to take as needed. RTED for any other concerns.  [CS]    Clinical Course User Index [CS] Truddie Hidden, MD     MDM Rules/Calculators/A&P Medical Decision Making Problems Addressed: Atypical chest pain: acute illness or injury  Amount and/or Complexity of Data Reviewed Labs: ordered. Decision-making details documented in ED Course. Radiology: ordered and independent interpretation performed. Decision-making details documented in ED Course. ECG/medicine tests: ordered and independent interpretation performed. Decision-making details documented in ED Course.  Risk Prescription drug management.     Final Clinical Impression(s) / ED Diagnoses Final diagnoses:  Atypical chest pain    Rx / DC Orders ED Discharge Orders          Ordered    hydrOXYzine (ATARAX) 25 MG tablet  Every 8 hours PRN        11/03/22 0357             Truddie Hidden, MD 11/03/22 [phone]

## 2022-11-07 DIAGNOSIS — R079 Chest pain, unspecified: Secondary | ICD-10-CM | POA: Diagnosis not present

## 2022-11-07 DIAGNOSIS — I10 Essential (primary) hypertension: Secondary | ICD-10-CM | POA: Diagnosis not present

## 2022-11-07 DIAGNOSIS — K219 Gastro-esophageal reflux disease without esophagitis: Secondary | ICD-10-CM | POA: Diagnosis not present

## 2022-11-16 ENCOUNTER — Ambulatory Visit (HOSPITAL_COMMUNITY): Payer: 59 | Attending: Cardiovascular Disease

## 2022-11-16 DIAGNOSIS — R072 Precordial pain: Secondary | ICD-10-CM | POA: Insufficient documentation

## 2022-11-16 LAB — ECHOCARDIOGRAM COMPLETE
Area-P 1/2: 3.63 cm2
S' Lateral: 3 cm

## 2023-04-04 DIAGNOSIS — Z0000 Encounter for general adult medical examination without abnormal findings: Secondary | ICD-10-CM | POA: Diagnosis not present

## 2023-04-05 DIAGNOSIS — Z0000 Encounter for general adult medical examination without abnormal findings: Secondary | ICD-10-CM | POA: Diagnosis not present

## 2023-04-05 DIAGNOSIS — E039 Hypothyroidism, unspecified: Secondary | ICD-10-CM | POA: Diagnosis not present

## 2023-04-05 DIAGNOSIS — Z114 Encounter for screening for human immunodeficiency virus [HIV]: Secondary | ICD-10-CM | POA: Diagnosis not present

## 2023-04-05 DIAGNOSIS — Z13220 Encounter for screening for lipoid disorders: Secondary | ICD-10-CM | POA: Diagnosis not present

## 2023-05-04 DIAGNOSIS — Z124 Encounter for screening for malignant neoplasm of cervix: Secondary | ICD-10-CM | POA: Diagnosis not present

## 2023-05-04 DIAGNOSIS — Z1272 Encounter for screening for malignant neoplasm of vagina: Secondary | ICD-10-CM | POA: Diagnosis not present

## 2023-05-04 DIAGNOSIS — Z01419 Encounter for gynecological examination (general) (routine) without abnormal findings: Secondary | ICD-10-CM | POA: Diagnosis not present

## 2023-06-29 DIAGNOSIS — K219 Gastro-esophageal reflux disease without esophagitis: Secondary | ICD-10-CM | POA: Diagnosis not present

## 2023-06-29 DIAGNOSIS — F331 Major depressive disorder, recurrent, moderate: Secondary | ICD-10-CM | POA: Diagnosis not present

## 2023-06-29 DIAGNOSIS — I10 Essential (primary) hypertension: Secondary | ICD-10-CM | POA: Diagnosis not present

## 2023-06-29 DIAGNOSIS — R5383 Other fatigue: Secondary | ICD-10-CM | POA: Diagnosis not present

## 2023-06-29 DIAGNOSIS — G4700 Insomnia, unspecified: Secondary | ICD-10-CM | POA: Diagnosis not present

## 2023-06-29 DIAGNOSIS — E039 Hypothyroidism, unspecified: Secondary | ICD-10-CM | POA: Diagnosis not present

## 2023-06-29 DIAGNOSIS — F411 Generalized anxiety disorder: Secondary | ICD-10-CM | POA: Diagnosis not present
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
## 2023-12-27 DIAGNOSIS — E039 Hypothyroidism, unspecified: Secondary | ICD-10-CM | POA: Diagnosis not present

## 2023-12-27 DIAGNOSIS — R5383 Other fatigue: Secondary | ICD-10-CM | POA: Diagnosis not present

## 2024-02-05 DIAGNOSIS — E039 Hypothyroidism, unspecified: Secondary | ICD-10-CM | POA: Diagnosis not present

## 2024-02-05 DIAGNOSIS — Z7689 Persons encountering health services in other specified circumstances: Secondary | ICD-10-CM | POA: Diagnosis not present

## 2024-02-05 DIAGNOSIS — R030 Elevated blood-pressure reading, without diagnosis of hypertension: Secondary | ICD-10-CM | POA: Diagnosis not present

## 2024-02-05 DIAGNOSIS — K219 Gastro-esophageal reflux disease without esophagitis: Secondary | ICD-10-CM | POA: Diagnosis not present

## 2024-02-05 DIAGNOSIS — R1013 Epigastric pain: Secondary | ICD-10-CM | POA: Diagnosis not present

## 2024-02-05 DIAGNOSIS — F419 Anxiety disorder, unspecified: Secondary | ICD-10-CM | POA: Diagnosis not present

## 2024-02-10 ENCOUNTER — Encounter (HOSPITAL_BASED_OUTPATIENT_CLINIC_OR_DEPARTMENT_OTHER): Payer: Self-pay

## 2024-02-10 ENCOUNTER — Emergency Department (HOSPITAL_BASED_OUTPATIENT_CLINIC_OR_DEPARTMENT_OTHER)

## 2024-02-10 ENCOUNTER — Emergency Department (HOSPITAL_BASED_OUTPATIENT_CLINIC_OR_DEPARTMENT_OTHER)
Admission: EM | Admit: 2024-02-10 | Discharge: 2024-02-10 | Disposition: A | Discharge status: Home / Self Care | Attending: Emergency Medicine | Admitting: Emergency Medicine

## 2024-02-10 ENCOUNTER — Other Ambulatory Visit: Payer: Self-pay

## 2024-02-10 DIAGNOSIS — Z79899 Other long term (current) drug therapy: Secondary | ICD-10-CM | POA: Diagnosis not present

## 2024-02-10 DIAGNOSIS — R079 Chest pain, unspecified: Secondary | ICD-10-CM | POA: Diagnosis not present

## 2024-02-10 DIAGNOSIS — R030 Elevated blood-pressure reading, without diagnosis of hypertension: Secondary | ICD-10-CM

## 2024-02-10 DIAGNOSIS — I10 Essential (primary) hypertension: Secondary | ICD-10-CM | POA: Insufficient documentation

## 2024-02-10 LAB — BASIC METABOLIC PANEL WITH GFR
Anion gap: 14 (ref 5–15)
BUN: 11 mg/dL (ref 8–23)
CO2: 23 mmol/L (ref 22–32)
Calcium: 10.1 mg/dL (ref 8.9–10.3)
Chloride: 98 mmol/L (ref 98–111)
Creatinine, Ser: 0.69 mg/dL (ref 0.44–1.00)
GFR, Estimated: >60 mL/min (ref >60)
Glucose, Bld: 130 mg/dL — ABNORMAL HIGH (ref 70–99)
Potassium: 3.7 mmol/L (ref 3.5–5.1)
Sodium: 135 mmol/L (ref 135–145)

## 2024-02-10 LAB — CBC
HCT: 40.5 % (ref 36.0–46.0)
Hemoglobin: 14.6 g/dL (ref 12.0–15.0)
MCH: 31.5 pg (ref 26.0–34.0)
MCHC: 36 g/dL (ref 30.0–36.0)
MCV: 87.3 fL (ref 80.0–100.0)
Platelets: 220 10*3/uL (ref 150–400)
RBC: 4.64 MIL/uL (ref 3.87–5.11)
RDW: 12.7 % (ref 11.5–15.5)
WBC: 7.3 10*3/uL (ref 4.0–10.5)
nRBC: 0 % (ref 0.0–0.2)

## 2024-02-10 LAB — TROPONIN T, HIGH SENSITIVITY
Troponin T High Sensitivity: <15 ng/L (ref <19)
Troponin T High Sensitivity: <15 ng/L (ref <19)

## 2024-02-10 NOTE — ED Triage Notes (Signed)
 Complaining of high blood pressure all week. She has been taking her lisinopril, losartan, to bring down the blood pressure. Now she is having chest pain. She took 1 nitroglycerin  before coming in.

## 2024-02-10 NOTE — ED Provider Notes (Signed)
 [facility] EMERGENCY DEPARTMENT AT [facility] Provider Note   CSN: [id number] Arrival date & time: 02/10/24  1936     Patient presents with: Chest Pain   Summer Roberts is a 63 y.o. female.   63 year old female with past medical history significant for hypertension, thyroid  disease presents today for concern of elevated blood pressure for the past week and chest pain that started today.  Chest pain has since resolved.  Denies any shortness of breath.  She states yesterday she also took her sisters lisinopril in addition to the losartan but it did not help.  The history is provided by the patient. No language interpreter was used.       Prior to Admission medications   Medication Sig Start Date End Date Taking? Authorizing Provider  acetaminophen  (TYLENOL ) 500 MG tablet Take 1,000 mg by mouth every 6 (six) hours as needed for mild pain.    [provider]  aspirin EC 81 MG tablet Take 81 mg by mouth daily as needed (for pain or headaches).  03/18/09   [provider]  hydrOXYzine  (ATARAX ) 25 MG tablet Take 1 tablet (25 mg total) by mouth every 8 (eight) hours as needed for anxiety. 11/03/22   Roselyn Carlin NOVAK, MD  metoprolol  tartrate (LOPRESSOR ) 100 MG tablet Take 1 tablet by mouth once for procedure. 10/21/22   Barbaraann Darryle Ned, MD  SYNTHROID  100 MCG tablet Take 100 mcg by mouth daily. 01/12/20   [provider]  VALERIAN ROOT PO Take 2 capsules by mouth 2 (two) times daily.    [provider]  losartan (COZAAR) 25 MG tablet Take 25 mg by mouth daily. 01/16/18 03/26/20  [provider]    Allergies: Patient has no known allergies.    Review of Systems  Constitutional:  Negative for chills and fever.  Respiratory:  Negative for shortness of breath.   Cardiovascular:  Positive for chest pain.  Gastrointestinal:  Negative for abdominal pain.  Neurological:  Negative for light-headedness.  All other systems reviewed and are  negative.   Updated Vital Signs BP 110/64   Pulse 62   Temp 98 F (36.7 C)   Resp 11   Ht 5' 4 (1.626 m)   Wt 85.3 kg   LMP  (LMP Unknown)   SpO2 97%   BMI 32.27 kg/m   Physical Exam Vitals and nursing note reviewed.  Constitutional:      General: She is not in acute distress.    Appearance: Normal appearance. She is not ill-appearing.  HENT:     Head: Normocephalic and atraumatic.     Nose: Nose normal.   Eyes:     Conjunctiva/sclera: Conjunctivae normal.   Pulmonary:     Effort: Pulmonary effort is normal. No respiratory distress.   Musculoskeletal:        General: No deformity.   Skin:    Findings: No rash.   Neurological:     Mental Status: She is alert.     (all labs ordered are listed, but only abnormal results are displayed) Labs Reviewed  BASIC METABOLIC PANEL WITH GFR - Abnormal; Notable for the following components:      Result Value   Glucose, Bld 130 (*)    All other components within normal limits  CBC  TROPONIN T, HIGH SENSITIVITY  TROPONIN T, HIGH SENSITIVITY    EKG: EKG Interpretation Date/Time:  Saturday February 10 2024 19:41:20 EDT Ventricular Rate:  96 PR Interval:  142 QRS Duration:  90  QT Interval:  360 QTC Calculation: 454 R Axis:   259  Text Interpretation: Normal sinus rhythm Right superior axis deviation Pulmonary disease pattern Right ventricular hypertrophy Nonspecific ST abnormality Abnormal ECG When compared with ECG of 03-Nov-2022 02:52, PREVIOUS ECG IS PRESENT when compared to prior, similar appearance with more artifact No STEMI Confirmed by Ginger Barefoot ([id number]) on 02/10/2024 11:11:08 PM  Radiology: [facility] Chest Port 1 View Result Date: 02/10/2024 CLINICAL DATA:  Chest pain. EXAM: PORTABLE CHEST 1 VIEW COMPARISON:  November 03, 2022 FINDINGS: The heart size and mediastinal contours are within normal limits. There is no evidence of acute infiltrate, pleural effusion or pneumothorax. The visualized skeletal structures are  unremarkable. IMPRESSION: No active disease. Electronically Signed   By: Suzen Dials M.D.   On: 02/10/2024 20:23     Procedures   Medications Ordered in the ED - No data to display                                  Medical Decision Making Amount and/or Complexity of Data Reviewed Labs: ordered. Radiology: ordered.   Medical Decision Making / ED Course   This patient presents to the ED for concern of pressure, chest pain, this involves an extensive number of treatment options, and is a complaint that carries with it a high risk of complications and morbidity.  The differential diagnosis includes hypertensive urgency, hypertensive emergency, ACS, elevated blood pressure  MDM: 63 year old female presents today for concern of elevated blood pressure, chest pain.  Chest pain has since resolved.  CBC unremarkable, BMP with glucose 130 otherwise without acute concern.  Troponin negative x 2, chest x-ray without acute cardiopulmonary process.  EKG without acute ischemic change.  Low suspicion for ACS.  Blood pressure improved.  Without chest pain.  Discussed follow-up with PCP and keeping blood pressure diary.  Discharged in stable condition.   Additional history obtained: -Additional history obtained from husband who is at bedside -External records from outside source obtained and reviewed including: Chart review including previous notes, labs, imaging, consultation notes   Lab Tests: -I ordered, reviewed, and interpreted labs.   The pertinent results include:   Labs Reviewed  BASIC METABOLIC PANEL WITH GFR - Abnormal; Notable for the following components:      Result Value   Glucose, Bld 130 (*)    All other components within normal limits  CBC  TROPONIN T, HIGH SENSITIVITY  TROPONIN T, HIGH SENSITIVITY      EKG  EKG Interpretation Date/Time:  Saturday February 10 2024 19:41:20 EDT Ventricular Rate:  96 PR Interval:  142 QRS Duration:  90 QT Interval:  360 QTC  Calculation: 454 R Axis:   259  Text Interpretation: Normal sinus rhythm Right superior axis deviation Pulmonary disease pattern Right ventricular hypertrophy Nonspecific ST abnormality Abnormal ECG When compared with ECG of 03-Nov-2022 02:52, PREVIOUS ECG IS PRESENT when compared to prior, similar appearance with more artifact No STEMI Confirmed by Ginger Barefoot ([id number]) on 02/10/2024 11:11:08 PM         Imaging Studies ordered: I ordered imaging studies including chest x-ray I independently visualized and interpreted imaging. I agree with the radiologist interpretation   Medicines ordered and prescription drug management: No orders of the defined types were placed in this encounter.   -I have reviewed the patients home medicines and have made adjustments as needed   Reevaluation: After the interventions noted above, I reevaluated  the patient and found that they have :improved  Co morbidities that complicate the patient evaluation  Past Medical History:  Diagnosis Date   Thyroid  disease       Dispostion: Discharged in stable condition.  Return precaution discussed.  Patient voices understanding and is in agreement with the plan.   Final diagnoses:  Elevated blood pressure reading    ED Discharge Orders     None          Hildegard Loge, [place] 02/10/24 2338    Tegeler, Lonni PARAS, MD 02/11/24 [phone]

## 2024-02-10 NOTE — Discharge Instructions (Signed)
 Your blood pressure improved in the emergency department.  Heart enzyme and other blood work was normal.  Keep a blood pressure diary and follow-up with your primary care doctor.  Do not combine losartan and lisinopril.  Take the prescribed losartan as directed.  Return for emergent symptoms.

## 2024-06-12 DIAGNOSIS — I10 Essential (primary) hypertension: Secondary | ICD-10-CM | POA: Diagnosis not present

## 2024-06-12 DIAGNOSIS — Q159 Congenital malformation of eye, unspecified: Secondary | ICD-10-CM | POA: Diagnosis not present

## 2024-06-12 DIAGNOSIS — E039 Hypothyroidism, unspecified: Secondary | ICD-10-CM | POA: Diagnosis not present

## 2024-06-12 DIAGNOSIS — F411 Generalized anxiety disorder: Secondary | ICD-10-CM | POA: Diagnosis not present

## 2024-06-12 DIAGNOSIS — Z0000 Encounter for general adult medical examination without abnormal findings: Secondary | ICD-10-CM | POA: Diagnosis not present

## 2024-06-12 DIAGNOSIS — F419 Anxiety disorder, unspecified: Secondary | ICD-10-CM | POA: Diagnosis not present

## 2024-06-12 DIAGNOSIS — Z800 Family history of malignant neoplasm of digestive organs: Secondary | ICD-10-CM | POA: Diagnosis not present

## 2024-06-14 DIAGNOSIS — Z800 Family history of malignant neoplasm of digestive organs: Secondary | ICD-10-CM | POA: Diagnosis not present

## 2024-06-14 DIAGNOSIS — Z13220 Encounter for screening for lipoid disorders: Secondary | ICD-10-CM | POA: Diagnosis not present

## 2024-06-14 DIAGNOSIS — Q159 Congenital malformation of eye, unspecified: Secondary | ICD-10-CM | POA: Diagnosis not present

## 2024-06-14 DIAGNOSIS — F419 Anxiety disorder, unspecified: Secondary | ICD-10-CM | POA: Diagnosis not present

## 2024-06-14 DIAGNOSIS — Z0000 Encounter for general adult medical examination without abnormal findings: Secondary | ICD-10-CM | POA: Diagnosis not present

## 2024-06-14 DIAGNOSIS — I10 Essential (primary) hypertension: Secondary | ICD-10-CM | POA: Diagnosis not present

## 2024-06-14 DIAGNOSIS — E039 Hypothyroidism, unspecified: Secondary | ICD-10-CM | POA: Diagnosis not present

## 2024-06-14 DIAGNOSIS — F411 Generalized anxiety disorder: Secondary | ICD-10-CM | POA: Diagnosis not present

## 2024-06-14 DIAGNOSIS — Z1159 Encounter for screening for other viral diseases: Secondary | ICD-10-CM | POA: Diagnosis not present

## 2024-07-10 DIAGNOSIS — H25813 Combined forms of age-related cataract, bilateral: Secondary | ICD-10-CM | POA: Diagnosis not present

## 2024-07-10 DIAGNOSIS — H35372 Puckering of macula, left eye: Secondary | ICD-10-CM | POA: Diagnosis not present

## 2024-07-24 DIAGNOSIS — Z01419 Encounter for gynecological examination (general) (routine) without abnormal findings: Secondary | ICD-10-CM | POA: Diagnosis not present

## 2024-07-24 DIAGNOSIS — E039 Hypothyroidism, unspecified: Secondary | ICD-10-CM | POA: Diagnosis not present

## 2024-08-29 ENCOUNTER — Encounter: Payer: Self-pay | Admitting: *Deleted

## 2024-08-29 NOTE — Progress Notes (Signed)
 Summer Roberts                                          MRN: [id number]   08/29/2024   The [facility]BCI Quality Team Specialist reviewed this patient medical record for the purposes of chart review for care gap closure. The following were reviewed: chart review for care gap closure-controlling blood pressure.    VBCI Quality Team
